# Patient Record
Sex: Male | Born: 1960 | Race: White | Hispanic: No | Marital: Married | Smoking: Former smoker
Health system: Southern US, Community
[De-identification: ages and names within clinical notes are randomized; demographics above are authoritative.]

## PROBLEM LIST (undated history)

## (undated) DIAGNOSIS — I10 Essential (primary) hypertension: Secondary | ICD-10-CM

## (undated) DIAGNOSIS — K759 Inflammatory liver disease, unspecified: Secondary | ICD-10-CM

## (undated) HISTORY — PX: TONSILLECTOMY: SUR1361

---

## 2005-02-14 ENCOUNTER — Emergency Department: Payer: Self-pay | Admitting: Emergency Medicine

## 2005-04-29 ENCOUNTER — Ambulatory Visit: Payer: Self-pay | Admitting: Orthopedic Surgery

## 2014-06-13 DIAGNOSIS — I10 Essential (primary) hypertension: Secondary | ICD-10-CM | POA: Diagnosis present

## 2014-06-13 DIAGNOSIS — Z8619 Personal history of other infectious and parasitic diseases: Secondary | ICD-10-CM | POA: Diagnosis present

## 2015-07-06 ENCOUNTER — Encounter: Payer: Self-pay | Admitting: *Deleted

## 2015-07-07 ENCOUNTER — Ambulatory Visit: Payer: BLUE CROSS/BLUE SHIELD | Admitting: Anesthesiology

## 2015-07-07 ENCOUNTER — Encounter: Payer: Self-pay | Admitting: *Deleted

## 2015-07-07 ENCOUNTER — Encounter
Admission: RE | Disposition: A | Discharge status: Home / Self Care | Payer: Self-pay | Source: Ambulatory Visit | Attending: Gastroenterology

## 2015-07-07 ENCOUNTER — Ambulatory Visit
Admission: RE | Admit: 2015-07-07 | Discharge: 2015-07-07 | Disposition: A | Discharge status: Home / Self Care | Payer: BLUE CROSS/BLUE SHIELD | Source: Ambulatory Visit | Attending: Gastroenterology | Admitting: Gastroenterology

## 2015-07-07 DIAGNOSIS — K6389 Other specified diseases of intestine: Secondary | ICD-10-CM | POA: Insufficient documentation

## 2015-07-07 DIAGNOSIS — Z1211 Encounter for screening for malignant neoplasm of colon: Secondary | ICD-10-CM | POA: Diagnosis not present

## 2015-07-07 DIAGNOSIS — D125 Benign neoplasm of sigmoid colon: Secondary | ICD-10-CM | POA: Insufficient documentation

## 2015-07-07 DIAGNOSIS — Z79899 Other long term (current) drug therapy: Secondary | ICD-10-CM | POA: Diagnosis not present

## 2015-07-07 DIAGNOSIS — B1920 Unspecified viral hepatitis C without hepatic coma: Secondary | ICD-10-CM | POA: Insufficient documentation

## 2015-07-07 DIAGNOSIS — Z87891 Personal history of nicotine dependence: Secondary | ICD-10-CM | POA: Diagnosis not present

## 2015-07-07 DIAGNOSIS — D122 Benign neoplasm of ascending colon: Secondary | ICD-10-CM | POA: Diagnosis not present

## 2015-07-07 DIAGNOSIS — K640 First degree hemorrhoids: Secondary | ICD-10-CM | POA: Diagnosis not present

## 2015-07-07 DIAGNOSIS — I10 Essential (primary) hypertension: Secondary | ICD-10-CM | POA: Diagnosis not present

## 2015-07-07 DIAGNOSIS — K5730 Diverticulosis of large intestine without perforation or abscess without bleeding: Secondary | ICD-10-CM | POA: Insufficient documentation

## 2015-07-07 DIAGNOSIS — Z7982 Long term (current) use of aspirin: Secondary | ICD-10-CM | POA: Insufficient documentation

## 2015-07-07 HISTORY — PX: COLONOSCOPY: SHX5424

## 2015-07-07 HISTORY — DX: Essential (primary) hypertension: I10

## 2015-07-07 HISTORY — DX: Inflammatory liver disease, unspecified: K75.9

## 2015-07-07 SURGERY — COLONOSCOPY
Anesthesia: General

## 2015-07-07 MED ORDER — MIDAZOLAM HCL 5 MG/5ML IJ SOLN
INTRAMUSCULAR | Status: DC | PRN
  Administered 2015-07-07: 2 mg via INTRAVENOUS

## 2015-07-07 MED ORDER — SODIUM CHLORIDE 0.9 % IV SOLN
INTRAVENOUS | Status: DC
  Administered 2015-07-07 (×2): via INTRAVENOUS

## 2015-07-07 MED ORDER — LIDOCAINE HCL (CARDIAC) 20 MG/ML IV SOLN
INTRAVENOUS | Status: DC | PRN
  Administered 2015-07-07: 40 mg via INTRAVENOUS

## 2015-07-07 MED ORDER — PROPOFOL 500 MG/50ML IV EMUL
INTRAVENOUS | Status: DC | PRN
  Administered 2015-07-07: 150 ug/kg/min via INTRAVENOUS

## 2015-07-07 MED ORDER — FENTANYL CITRATE (PF) 100 MCG/2ML IJ SOLN
INTRAMUSCULAR | Status: DC | PRN
  Administered 2015-07-07: 50 ug via INTRAVENOUS

## 2015-07-07 MED ORDER — EPHEDRINE SULFATE 50 MG/ML IJ SOLN
INTRAMUSCULAR | Status: DC | PRN
  Administered 2015-07-07: 10 mg via INTRAVENOUS

## 2015-07-07 MED ORDER — PROPOFOL 10 MG/ML IV BOLUS
INTRAVENOUS | Status: DC | PRN
  Administered 2015-07-07: 60 mg via INTRAVENOUS

## 2015-07-07 NOTE — OR Nursing (Signed)
NS lift done at [facility] colon polyp removal site.

## 2015-07-07 NOTE — Op Note (Signed)
[facility] Patient Name: Sean Sexton Procedure Date: 07/07/2015 1:09 PM MRN: [id number] Account #: [id number] Date of Birth: [birth year] Admit Type: Outpatient Age: 54 Room: [facility] ENDO ROOM 3 Gender: Male Note Status: Finalized Procedure:         Colonoscopy Indications:       Screening for colorectal malignant neoplasm, This is the                     patient's first colonoscopy Patient Profile:   This is a 54 year old male. Providers:         Gerrit Heck. Rayann Heman, MD Referring MD:      Caprice Renshaw (Referring MD) Medicines:         Propofol per Anesthesia Complications:     No immediate complications. Procedure:         Pre-Anesthesia Assessment:                    - Prior to the procedure, a History and Physical was                     performed, and patient medications, allergies and                     sensitivities were reviewed. The patient's tolerance of                     previous anesthesia was reviewed.                    - Prior to the procedure, a History and Physical was                     performed, and patient medications, allergies and                     sensitivities were reviewed. The patient's tolerance of                     previous anesthesia was reviewed.                    After obtaining informed consent, the colonoscope was                     passed under direct vision. Throughout the procedure, the                     patient's blood pressure, pulse, and oxygen saturations                     were monitored continuously. The Olympus CF-H180AL                     colonoscope ( S#: [id number] ) was introduced through the                     anus and advanced to the the cecum, identified by                     appendiceal orifice and ileocecal valve. The colonoscopy                     was performed without difficulty. The patient tolerated  the procedure well. The quality of the bowel preparation                      was excellent. Findings:      The perianal and digital rectal examinations were normal.      A localized area of altered vascular, erythematous and granular mucosa       was found in the cecum. Biopsies were taken with a cold forceps for       histology.      A 10 mm polyp was found in the ascending colon. The polyp was flat. The       polyp was removed with a saline injection-lift technique using a hot       snare. Resection and retrieval were complete.      A 3 mm polyp was found in the sigmoid colon. The polyp was sessile. The       polyp was removed with a jumbo cold forceps. Resection and retrieval       were complete.      A few small-mouthed diverticula were found in the sigmoid colon.      Internal hemorrhoids were found during retroflexion. The hemorrhoids       were Grade I (internal hemorrhoids that do not prolapse).      The exam was otherwise without abnormality. Impression:        - Altered vascular, erythematous and granular mucosa in                     the cecum. Biopsied.                    - One 10 mm polyp in the ascending colon. Resected and                     retrieved.                    - One 3 mm polyp in the sigmoid colon. Resected and                     retrieved.                    - Diverticulosis in the sigmoid colon.                    - Internal hemorrhoids.                    - The examination was otherwise normal. Recommendation:    - Observe patient in GI recovery unit.                    - High fiber diet.                    - Continue present medications.                    - Await pathology results.                    - Repeat colonoscopy for surveillance based on pathology                     results.                    - Return to referring physician.                    -  The findings and recommendations were discussed with the                     patient.                    - The findings and recommendations were  discussed with the                     patient's family. Procedure Code(s): --- Professional ---                    [phone], Colonoscopy, flexible; with endoscopic mucosal                     resection                    45380, 65, Colonoscopy, flexible; with biopsy, single or                     multiple Diagnosis Code(s): --- Professional ---                    Z12.11, Encounter for screening for malignant neoplasm of                     colon                    K63.89, Other specified diseases of intestine                    D12.2, Benign neoplasm of ascending colon                    D12.5, Benign neoplasm of sigmoid colon                    K64.0, First degree hemorrhoids                    K57.30, Diverticulosis of large intestine without                     perforation or abscess without bleeding CPT copyright 2014 American Medical Association. All rights reserved. The codes documented in this report are preliminary and upon coder review may  be revised to meet current compliance requirements. Mellody Life, MD 07/07/2015 1:41:00 PM This report has been signed electronically. Number of Addenda: 0 Note Initiated On: 07/07/2015 1:09 PM Scope Withdrawal Time: 0 hours 20 minutes 0 seconds  Total Procedure Duration: 0 hours 22 minutes 47 seconds       [facility]

## 2015-07-07 NOTE — Transfer of Care (Signed)
Immediate Anesthesia Transfer of Care Note  Patient: Sean Sexton  Procedure(s) Performed: Procedure(s): COLONOSCOPY (N/A)  Patient Location: PACU  Anesthesia Type:General  Level of Consciousness: sedated  Airway & Oxygen Therapy: Patient Spontanous Breathing and Patient connected to nasal cannula oxygen  Post-op Assessment: Report given to RN and Post -op Vital signs reviewed and stable  Post vital signs: Reviewed and stable  Last Vitals:  Filed Vitals:   07/07/15 1248 07/07/15 1340  BP: 115/77 90/67  Pulse: 70 79  Temp: 36.8 C 36 C  Resp: 20 12    Complications: No apparent anesthesia complications

## 2015-07-07 NOTE — Anesthesia Preprocedure Evaluation (Signed)
Anesthesia Evaluation  Patient identified by MRN, date of birth, ID band Patient awake    Airway Mallampati: II       Dental  (+) Teeth Intact   Pulmonary neg pulmonary ROS, former smoker,    Pulmonary exam normal        Cardiovascular Exercise Tolerance: Good hypertension, Pt. on medications  Rhythm:Regular     Neuro/Psych    GI/Hepatic negative GI ROS,   Endo/Other  negative endocrine ROS  Renal/GU negative Renal ROS     Musculoskeletal negative musculoskeletal ROS (+)   Abdominal Normal abdominal exam  (+)   Peds negative pediatric ROS (+)  Hematology negative hematology ROS (+)   Anesthesia Other Findings   Reproductive/Obstetrics                             Anesthesia Physical Anesthesia Plan  ASA: II  Anesthesia Plan: General   Post-op Pain Management:    Induction: Intravenous  Airway Management Planned: Nasal Cannula  Additional Equipment:   Intra-op Plan:   Post-operative Plan:   Informed Consent: I have reviewed the patients History and Physical, chart, labs and discussed the procedure including the risks, benefits and alternatives for the proposed anesthesia with the patient or authorized representative who has indicated his/her understanding and acceptance.     Plan Discussed with: CRNA  Anesthesia Plan Comments:         Anesthesia Quick Evaluation

## 2015-07-07 NOTE — Anesthesia Procedure Notes (Signed)
Date/Time: 07/07/2015 1:20 PM Performed by: Allean Found Pre-anesthesia Checklist: Patient identified, Emergency Drugs available, Suction available, Patient being monitored and Timeout performed Patient Re-evaluated:Patient Re-evaluated prior to inductionOxygen Delivery Method: Nasal cannula

## 2015-07-07 NOTE — Anesthesia Postprocedure Evaluation (Signed)
Anesthesia Post Note  Patient: Sean Sexton  Procedure(s) Performed: Procedure(s) (LRB): COLONOSCOPY (N/A)  Patient location during evaluation: PACU Anesthesia Type: General Level of consciousness: awake Pain management: pain level controlled Vital Signs Assessment: post-procedure vital signs reviewed and stable Respiratory status: respiratory function stable Cardiovascular status: stable Anesthetic complications: no    Last Vitals:  Filed Vitals:   07/07/15 1340 07/07/15 1350  BP: 90/67 95/66  Pulse: 79 79  Temp: 36 C   Resp: 12 18    Last Pain: There were no vitals filed for this visit.               VAN STAVEREN,Avo Schlachter

## 2015-07-07 NOTE — H&P (Signed)
  Primary Care Physician:  Marcello Fennel, MD  Pre-Procedure History & Physical: HPI:  Sean Sexton is a 54 y.o. male is here for an colonoscopy.   Past Medical History  Diagnosis Date  . Hypertension   . Hepatitis     hept.c    Past Surgical History  Procedure Laterality Date  . Tonsillectomy      Prior to Admission medications   Medication Sig Start Date End Date Taking? Authorizing Provider  aspirin EC 81 MG tablet Take 81 mg by mouth daily.   Yes Historical Provider, MD  lisinopril-hydrochlorothiazide (PRINZIDE,ZESTORETIC) 20-12.5 MG tablet Take 1 tablet by mouth daily.   Yes Historical Provider, MD  Multiple Vitamin (MULTIVITAMIN) tablet Take 1 tablet by mouth daily.   Yes Historical Provider, MD    Allergies as of 05/25/2015  . (Not on File)    History reviewed. No pertinent family history.  Social History   Social History  . Marital Status: Married    Spouse Name: N/A  . Number of Children: N/A  . Years of Education: N/A   Occupational History  . Not on file.   Social History Main Topics  . Smoking status: Former Research scientist (life sciences)  . Smokeless tobacco: Not on file  . Alcohol Use: No  . Drug Use: No  . Sexual Activity: Not on file   Other Topics Concern  . Not on file   Social History Narrative     Physical Exam: BP 115/77 mmHg  Pulse 70  Temp(Src) 98.2 F (36.8 C) (Oral)  Resp 20  Ht 6' (1.829 m)  Wt 92.987 kg (205 lb)  BMI 27.80 kg/m2  SpO2 99% General:   Alert,  pleasant and cooperative in NAD Head:  Normocephalic and atraumatic. Neck:  Supple; no masses or thyromegaly. Lungs:  Clear throughout to auscultation.    Heart:  Regular rate and rhythm. Abdomen:  Soft, nontender and nondistended. Normal bowel sounds, without guarding, and without rebound.   Neurologic:  Alert and  oriented x4;  grossly normal neurologically.  Impression/Plan: TIWAN MOSCHELLA is here for an colonoscopy to be performed for screening colon  Risks, benefits,  limitations, and alternatives regarding  colonoscopy have been reviewed with the patient.  Questions have been answered.  All parties agreeable.   Josefine Class, MD  07/07/2015, 1:07 PM

## 2015-07-10 ENCOUNTER — Encounter: Payer: Self-pay | Admitting: Gastroenterology

## 2015-07-10 ENCOUNTER — Other Ambulatory Visit: Payer: Self-pay | Admitting: Gastroenterology

## 2015-07-10 DIAGNOSIS — B182 Chronic viral hepatitis C: Secondary | ICD-10-CM

## 2015-07-10 LAB — SURGICAL PATHOLOGY

## 2015-07-19 ENCOUNTER — Ambulatory Visit: Admission: RE | Admit: 2015-07-19 | Payer: BLUE CROSS/BLUE SHIELD | Source: Ambulatory Visit

## 2015-07-19 ENCOUNTER — Ambulatory Visit: Payer: BLUE CROSS/BLUE SHIELD

## 2015-07-21 ENCOUNTER — Ambulatory Visit
Admission: RE | Admit: 2015-07-21 | Discharge: 2015-07-21 | Disposition: A | Discharge status: Home / Self Care | Payer: BLUE CROSS/BLUE SHIELD | Source: Ambulatory Visit | Attending: Gastroenterology | Admitting: Gastroenterology

## 2015-07-21 DIAGNOSIS — B182 Chronic viral hepatitis C: Secondary | ICD-10-CM | POA: Diagnosis present

## 2015-07-21 DIAGNOSIS — K8020 Calculus of gallbladder without cholecystitis without obstruction: Secondary | ICD-10-CM | POA: Insufficient documentation

## 2016-09-04 IMAGING — US US ABDOMEN LIMITED
1 series · 13 of 25 positions shown · non-contrast
Comparison: None.

CLINICAL DATA: Chronic hepatitis-C

EXAM:
US ABDOMEN LIMITED - RIGHT UPPER QUADRANT
ULTRASOUND HEPATIC ELASTOGRAPHY
TECHNIQUE: Limited right upper quadrant abdominal ultrasound was performed. In
addition, ultrasound elastography evaluation of the liver was
performed. A region of interest was placed in the right lobe of the
liver. Following application of a compressive sonographic pulse,
shear waves were detected in the adjacent hepatic tissue and the
shear wave velocity was calculated. Multiple assessments were
performed at the selected site. Median shear wave velocity is
correlated to a Metavir fibrosis score.

[Series 1: us abdomen limited · 0.21mm/px · 13 of 32 slices shown]
[im 1/32]
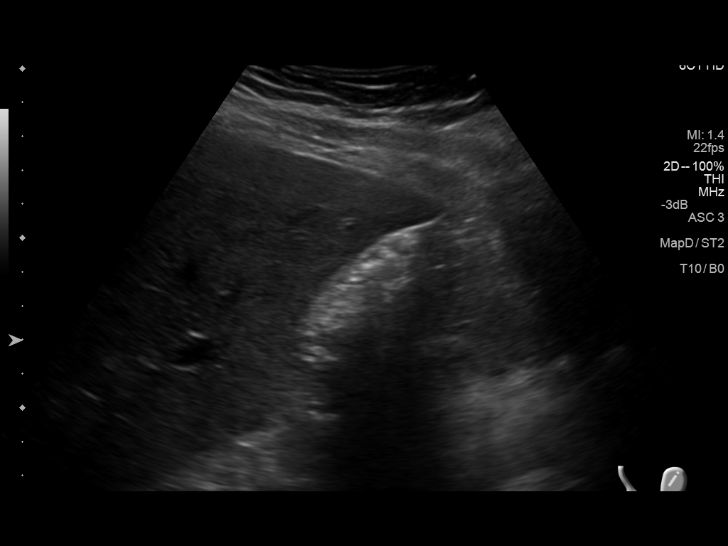
[im 3/32]
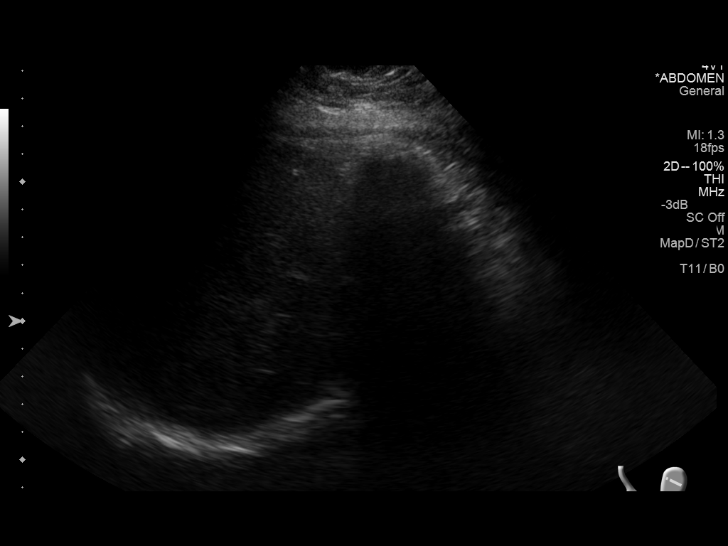
[im 6/32]
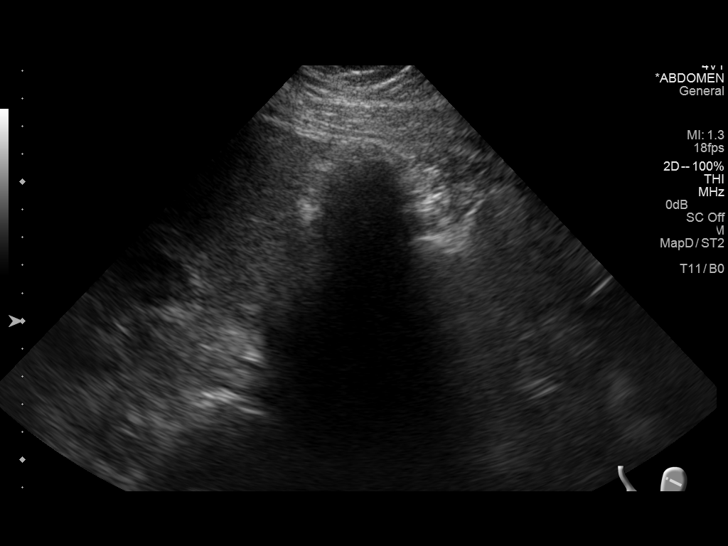
[im 8/32]
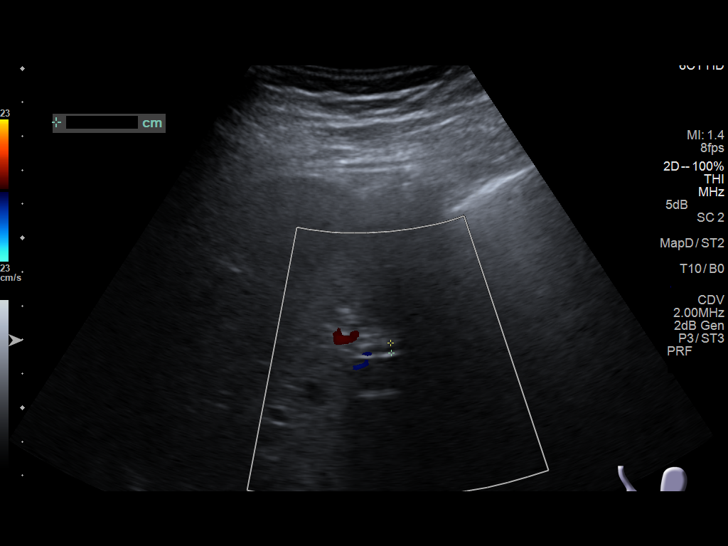
[im 11/32]
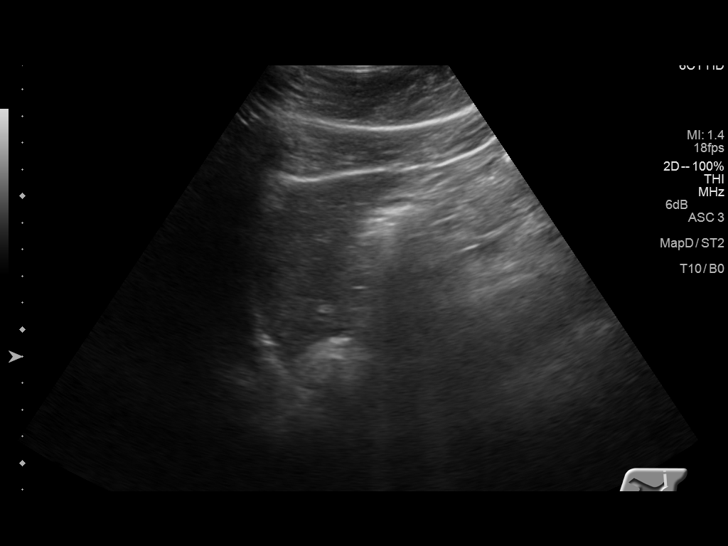
[im 13/32]
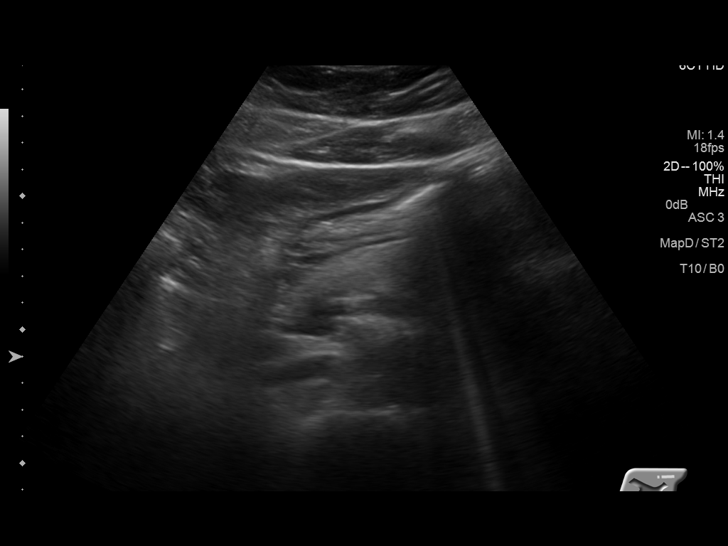
[im 16/32]
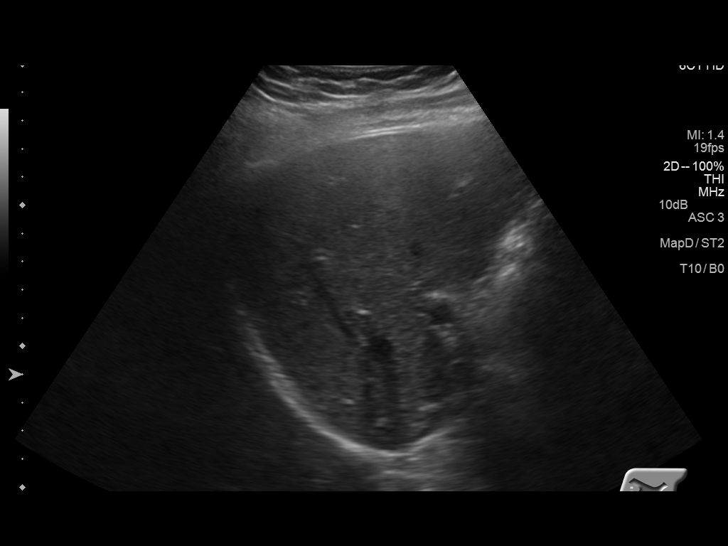
[im 19/32]
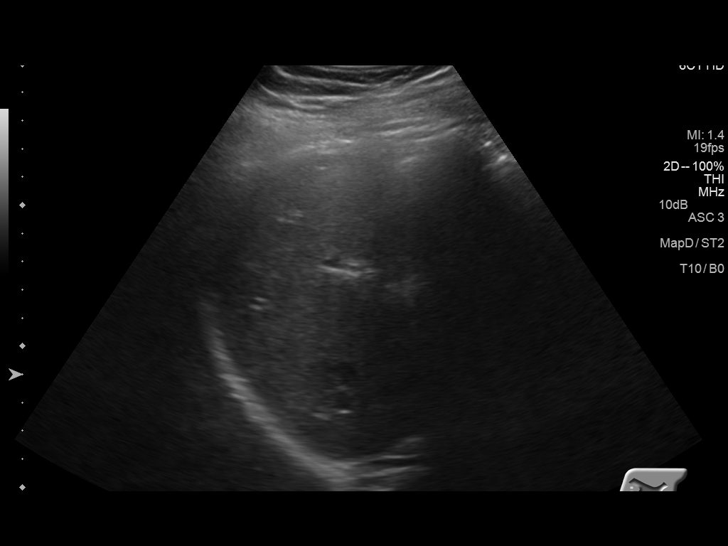
[im 21/32]
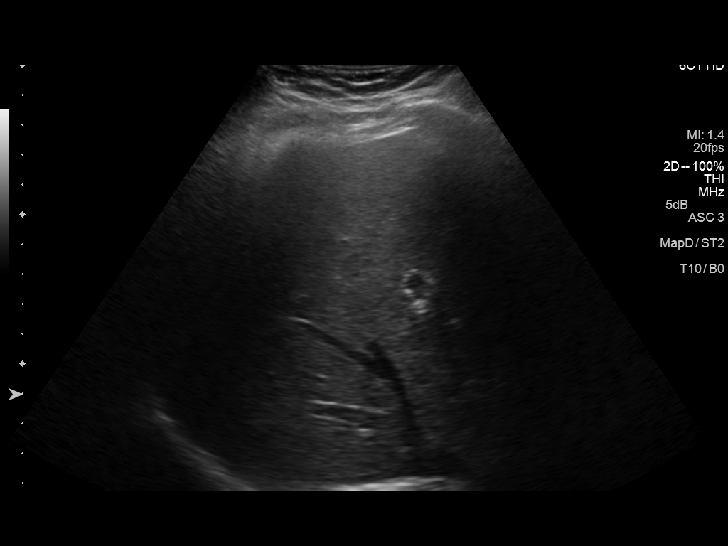
[im 24/32]
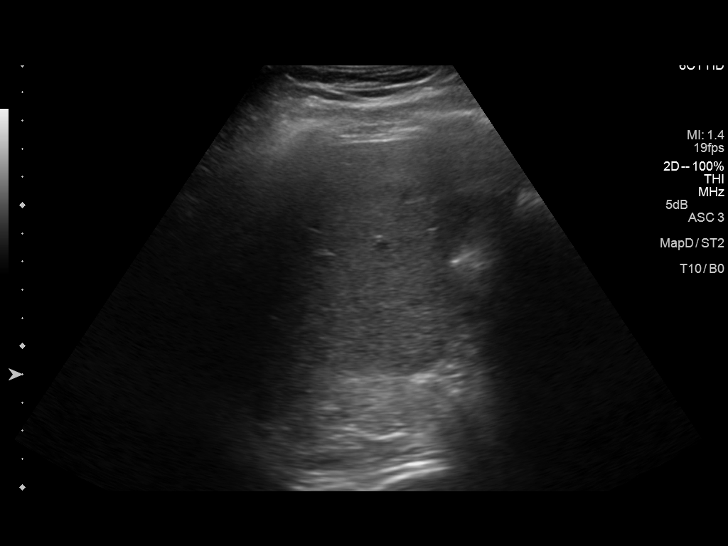
[im 26/32]
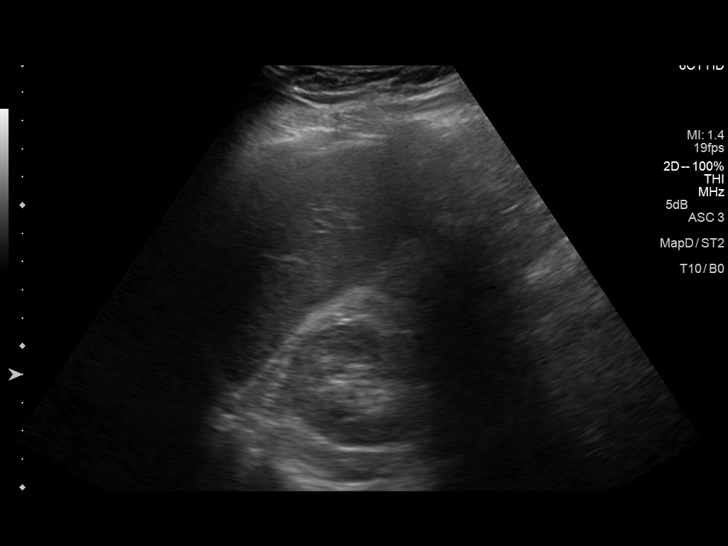
[im 29/32]
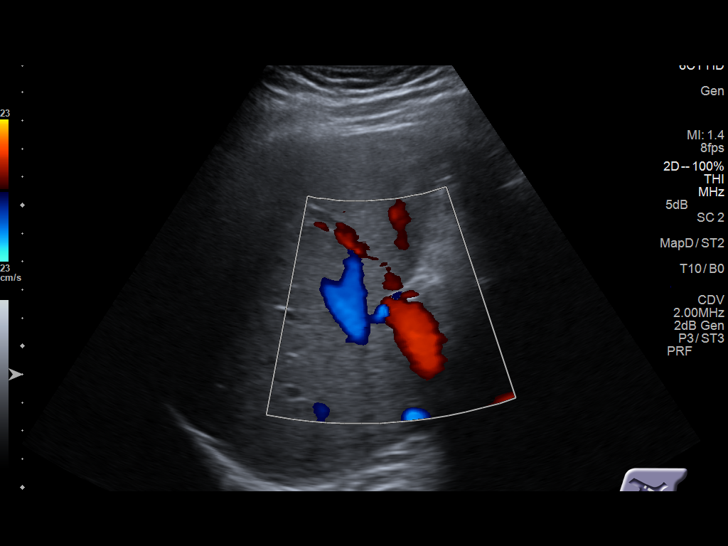
[im 32/32]
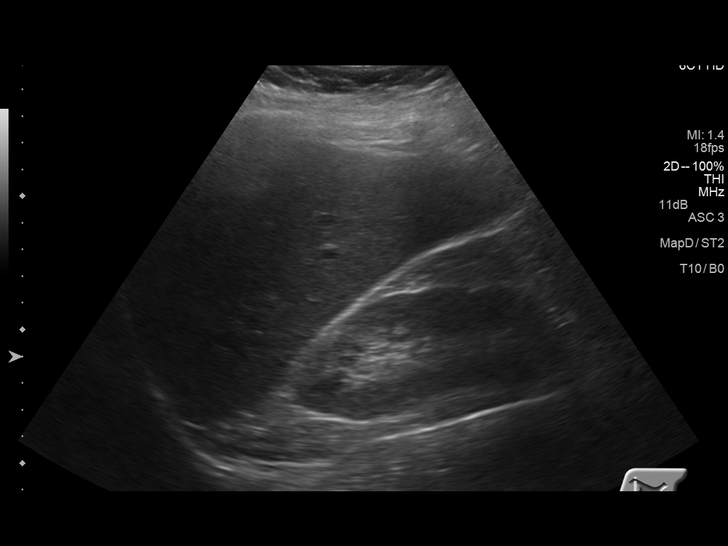

[13 of 25 positions shown; findings below may reference images not displayed]

FINDINGS: ULTRASOUND ABDOMEN LIMITED RIGHT UPPER QUADRANT

Gallbladder: Gallbladder is contracted and filled with stones.
Unable to visualize gallbladder wall. Negative sonographic Saelee.

Common bile duct: Diameter: Normal caliber, 3 mm

Liver: No focal lesion identified. Within normal limits in
parenchymal echogenicity.

ULTRASOUND HEPATIC ELASTOGRAPHY

Device: Siemens Helix VQ

Transducer: 6 H0

Patient position: Spine

Number of measurements:  10

Hepatic Segment:  8

Median velocity:   0.87  m/sec

IQR:

IQR/Median velocity ratio:

Corresponding Metavir fibrosis score:  F0/F1

Risk of fibrosis: Minimal

Limitations of exam: None

Pertinent findings noted on other imaging exams:  None

Please note that abnormal shear wave velocities may also be
identified in clinical settings other than with hepatic fibrosis,
such as: acute hepatitis, elevated right heart and central venous
pressures including use of beta blockers, Hilaluddin disease
(Perico), infiltrative processes such as
mastocytosis/amyloidosis/infiltrative tumor, extrahepatic
cholestasis, in the post-prandial state, and liver transplantation.
Correlation with patient history, laboratory data, and clinical
condition recommended.
IMPRESSION: Cholelithiasis. Gallbladder is contracted. No sonographic evidence
of acute cholecystitis.

Median hepatic shear wave velocity is calculated at 0.87 m/sec.
Corresponding Metavir fibrosis score is F 0/F 1.
Risk of fibrosis is minimal.

Follow-up:  None required

## 2022-05-01 ENCOUNTER — Other Ambulatory Visit: Payer: Self-pay

## 2022-05-01 DIAGNOSIS — Z125 Encounter for screening for malignant neoplasm of prostate: Secondary | ICD-10-CM

## 2022-05-06 ENCOUNTER — Other Ambulatory Visit: Payer: Self-pay | Admitting: *Deleted

## 2022-05-06 DIAGNOSIS — Z125 Encounter for screening for malignant neoplasm of prostate: Secondary | ICD-10-CM

## 2022-05-06 NOTE — Progress Notes (Signed)
Patient: Sean Sexton           Date of Birth: [birth year]           MRN: [id number] Visit Date: 05/06/2022 PCP: Derinda Late, MD  Prostate Cancer Screening Date of last physical exam:  (January 2023) Date of last rectal exam:  (N/A) Have you ever had any of the following?: Family member with prostate cancer Have you ever had or been told you have an allergy to latex products?: No Are you currently taking any natural prostate preparations?: No Are you currently experiencing any urinary symptoms?: No  Prostate Exam Exam not completed.  PSA Only Completed. Patient's History There are no problems to display for this patient.  Past Medical History:  Diagnosis Date   Hepatitis    hept.c   Hypertension     No family history on file.  Social History   Occupational History   Not on file  Tobacco Use   Smoking status: Former   Smokeless tobacco: Not on file  Substance and Sexual Activity   Alcohol use: No   Drug use: No   Sexual activity: Not on file

## 2022-05-07 LAB — PSA: Prostate Specific Ag, Serum: 0.7 ng/mL (ref 0.0–4.0)

## 2022-05-17 ENCOUNTER — Telehealth: Payer: Self-pay

## 2022-05-17 NOTE — Telephone Encounter (Signed)
Attempted to contact patient regarding PSA results, no answer, "Call can not be completed at this time'.

## 2022-06-12 ENCOUNTER — Telehealth: Payer: Self-pay

## 2022-06-12 NOTE — Telephone Encounter (Signed)
Normal PSA letter mailed.   June 12, 2022         Dear Mr. Sean Sexton,   Thank you for your participation in the Prostate Cancer Screening at [facility] on May 06, 2022.   The result of your blood test for the level of the Prostate Specific Antigen (PSA) was found to be normal. It is recommended that continue yearly screening and share these results with your physician.  If you have any questions about these results, please call Etheleen Sia at [phone] or [facility] at [phone].  Sincerely,     Etheleen Sia, MSN, [place], [place]

## 2022-07-12 DIAGNOSIS — K859 Acute pancreatitis without necrosis or infection, unspecified: Secondary | ICD-10-CM | POA: Diagnosis not present

## 2022-07-12 DIAGNOSIS — B1920 Unspecified viral hepatitis C without hepatic coma: Secondary | ICD-10-CM | POA: Diagnosis present

## 2022-07-12 DIAGNOSIS — Z87891 Personal history of nicotine dependence: Secondary | ICD-10-CM

## 2022-07-12 DIAGNOSIS — K8511 Biliary acute pancreatitis with uninfected necrosis: Secondary | ICD-10-CM | POA: Diagnosis not present

## 2022-07-12 DIAGNOSIS — I10 Essential (primary) hypertension: Secondary | ICD-10-CM | POA: Diagnosis present

## 2022-07-12 DIAGNOSIS — Z79899 Other long term (current) drug therapy: Secondary | ICD-10-CM

## 2022-07-12 DIAGNOSIS — Z7982 Long term (current) use of aspirin: Secondary | ICD-10-CM

## 2022-07-12 DIAGNOSIS — N179 Acute kidney failure, unspecified: Secondary | ICD-10-CM | POA: Diagnosis present

## 2022-07-12 DIAGNOSIS — K8000 Calculus of gallbladder with acute cholecystitis without obstruction: Secondary | ICD-10-CM | POA: Diagnosis present

## 2022-07-12 DIAGNOSIS — A419 Sepsis, unspecified organism: Secondary | ICD-10-CM | POA: Diagnosis not present

## 2022-07-12 LAB — CBC
HCT: 46.6 % (ref 39.0–52.0)
Hemoglobin: 16 g/dL (ref 13.0–17.0)
MCH: 35.4 pg — ABNORMAL HIGH (ref 26.0–34.0)
MCHC: 34.3 g/dL (ref 30.0–36.0)
MCV: 103.1 fL — ABNORMAL HIGH (ref 80.0–100.0)
Platelets: 253 10*3/uL (ref 150–400)
RBC: 4.52 MIL/uL (ref 4.22–5.81)
RDW: 12.9 % (ref 11.5–15.5)
WBC: 13.2 10*3/uL — ABNORMAL HIGH (ref 4.0–10.5)
nRBC: 0 % (ref 0.0–0.2)

## 2022-07-12 LAB — COMPREHENSIVE METABOLIC PANEL
ALT: 161 U/L — ABNORMAL HIGH (ref 0–44)
AST: 199 U/L — ABNORMAL HIGH (ref 15–41)
Albumin: 3.7 g/dL (ref 3.5–5.0)
Alkaline Phosphatase: 128 U/L — ABNORMAL HIGH (ref 38–126)
Anion gap: 8 (ref 5–15)
BUN: 26 mg/dL — ABNORMAL HIGH (ref 8–23)
CO2: 27 mmol/L (ref 22–32)
Calcium: 8.9 mg/dL (ref 8.9–10.3)
Chloride: 102 mmol/L (ref 98–111)
Creatinine, Ser: 1.37 mg/dL — ABNORMAL HIGH (ref 0.61–1.24)
GFR, Estimated: 58 mL/min — ABNORMAL LOW (ref >60)
Glucose, Bld: 143 mg/dL — ABNORMAL HIGH (ref 70–99)
Potassium: 3.8 mmol/L (ref 3.5–5.1)
Sodium: 137 mmol/L (ref 135–145)
Total Bilirubin: 1.1 mg/dL (ref 0.3–1.2)
Total Protein: 7.6 g/dL (ref 6.5–8.1)

## 2022-07-12 NOTE — ED Triage Notes (Signed)
Pt sts that he is having abd pain that is in the center of the abd. Pt has been vomiting

## 2022-07-13 ENCOUNTER — Inpatient Hospital Stay
Admission: EM | Admit: 2022-07-13 | Discharge: 2022-07-21 | DRG: 417 | Disposition: A | Discharge status: Home / Self Care | Payer: 59 | Attending: Internal Medicine | Admitting: Internal Medicine

## 2022-07-13 ENCOUNTER — Other Ambulatory Visit: Payer: Self-pay

## 2022-07-13 ENCOUNTER — Encounter: Payer: Self-pay | Admitting: Internal Medicine

## 2022-07-13 ENCOUNTER — Emergency Department: Payer: 59

## 2022-07-13 DIAGNOSIS — K8591 Acute pancreatitis with uninfected necrosis, unspecified: Secondary | ICD-10-CM

## 2022-07-13 DIAGNOSIS — K859 Acute pancreatitis without necrosis or infection, unspecified: Secondary | ICD-10-CM | POA: Diagnosis present

## 2022-07-13 DIAGNOSIS — R7989 Other specified abnormal findings of blood chemistry: Secondary | ICD-10-CM

## 2022-07-13 DIAGNOSIS — Z7982 Long term (current) use of aspirin: Secondary | ICD-10-CM | POA: Diagnosis not present

## 2022-07-13 DIAGNOSIS — K851 Biliary acute pancreatitis without necrosis or infection: Secondary | ICD-10-CM | POA: Diagnosis not present

## 2022-07-13 DIAGNOSIS — N179 Acute kidney failure, unspecified: Secondary | ICD-10-CM

## 2022-07-13 DIAGNOSIS — K8000 Calculus of gallbladder with acute cholecystitis without obstruction: Secondary | ICD-10-CM | POA: Diagnosis present

## 2022-07-13 DIAGNOSIS — Z79899 Other long term (current) drug therapy: Secondary | ICD-10-CM | POA: Diagnosis not present

## 2022-07-13 DIAGNOSIS — I10 Essential (primary) hypertension: Secondary | ICD-10-CM | POA: Diagnosis present

## 2022-07-13 DIAGNOSIS — K8511 Biliary acute pancreatitis with uninfected necrosis: Secondary | ICD-10-CM | POA: Diagnosis present

## 2022-07-13 DIAGNOSIS — Z87891 Personal history of nicotine dependence: Secondary | ICD-10-CM | POA: Diagnosis not present

## 2022-07-13 DIAGNOSIS — Z8619 Personal history of other infectious and parasitic diseases: Secondary | ICD-10-CM | POA: Diagnosis present

## 2022-07-13 DIAGNOSIS — B1920 Unspecified viral hepatitis C without hepatic coma: Secondary | ICD-10-CM | POA: Diagnosis present

## 2022-07-13 DIAGNOSIS — A419 Sepsis, unspecified organism: Secondary | ICD-10-CM | POA: Diagnosis not present

## 2022-07-13 DIAGNOSIS — R112 Nausea with vomiting, unspecified: Secondary | ICD-10-CM

## 2022-07-13 LAB — HEPATITIS PANEL, ACUTE
HCV Ab: REACTIVE — AB
Hep A IgM: NONREACTIVE
Hep B C IgM: NONREACTIVE
Hepatitis B Surface Ag: NONREACTIVE

## 2022-07-13 LAB — COMPREHENSIVE METABOLIC PANEL
ALT: 103 U/L — ABNORMAL HIGH (ref 0–44)
AST: 86 U/L — ABNORMAL HIGH (ref 15–41)
Albumin: 3.3 g/dL — ABNORMAL LOW (ref 3.5–5.0)
Alkaline Phosphatase: 97 U/L (ref 38–126)
Anion gap: 6 (ref 5–15)
BUN: 24 mg/dL — ABNORMAL HIGH (ref 8–23)
CO2: 27 mmol/L (ref 22–32)
Calcium: 8.3 mg/dL — ABNORMAL LOW (ref 8.9–10.3)
Chloride: 105 mmol/L (ref 98–111)
Creatinine, Ser: 1.14 mg/dL (ref 0.61–1.24)
GFR, Estimated: >60 mL/min (ref >60)
Glucose, Bld: 93 mg/dL (ref 70–99)
Potassium: 3.7 mmol/L (ref 3.5–5.1)
Sodium: 138 mmol/L (ref 135–145)
Total Bilirubin: 1.3 mg/dL — ABNORMAL HIGH (ref 0.3–1.2)
Total Protein: 6.9 g/dL (ref 6.5–8.1)

## 2022-07-13 LAB — LIPASE, BLOOD
Lipase: 1042 U/L — ABNORMAL HIGH (ref 11–51)
Lipase: 310 U/L — ABNORMAL HIGH (ref 11–51)

## 2022-07-13 LAB — HIV ANTIBODY (ROUTINE TESTING W REFLEX): HIV Screen 4th Generation wRfx: NONREACTIVE

## 2022-07-13 LAB — CBG MONITORING, ED: Glucose-Capillary: 94 mg/dL (ref 70–99)

## 2022-07-13 MED ORDER — ONDANSETRON HCL 4 MG PO TABS: 4.0000 mg | ORAL_TABLET | Freq: Four times a day (QID) | ORAL | Status: DC | PRN

## 2022-07-13 MED ORDER — MORPHINE SULFATE (PF) 4 MG/ML IV SOLN
4.0000 mg | Freq: Once | INTRAVENOUS | Status: AC
  Administered 2022-07-13: 4 mg via INTRAVENOUS
  Filled 2022-07-13: qty 1

## 2022-07-13 MED ORDER — ONDANSETRON HCL 4 MG/2ML IJ SOLN
4.0000 mg | INTRAMUSCULAR | Status: AC
  Administered 2022-07-13: 4 mg via INTRAVENOUS
  Filled 2022-07-13: qty 2

## 2022-07-13 MED ORDER — HYDRALAZINE HCL 20 MG/ML IJ SOLN: 5.0000 mg | INTRAMUSCULAR | Status: DC | PRN

## 2022-07-13 MED ORDER — ACETAMINOPHEN 325 MG PO TABS
650.0000 mg | ORAL_TABLET | Freq: Four times a day (QID) | ORAL | Status: DC | PRN
  Administered 2022-07-15 – 2022-07-20 (×3): 650 mg via ORAL
  Filled 2022-07-13 (×3): qty 2

## 2022-07-13 MED ORDER — MORPHINE SULFATE (PF) 2 MG/ML IV SOLN
2.0000 mg | INTRAVENOUS | Status: DC | PRN
  Administered 2022-07-13 – 2022-07-16 (×18): 2 mg via INTRAVENOUS
  Filled 2022-07-13 (×18): qty 1

## 2022-07-13 MED ORDER — LACTATED RINGERS IV SOLN: INTRAVENOUS | Status: DC

## 2022-07-13 MED ORDER — ACETAMINOPHEN 650 MG RE SUPP: 650.0000 mg | Freq: Four times a day (QID) | RECTAL | Status: DC | PRN

## 2022-07-13 MED ORDER — ONDANSETRON HCL 4 MG/2ML IJ SOLN: 4.0000 mg | Freq: Four times a day (QID) | INTRAMUSCULAR | Status: DC | PRN

## 2022-07-13 MED ORDER — LACTATED RINGERS IV BOLUS
1000.0000 mL | Freq: Once | INTRAVENOUS | Status: AC
  Administered 2022-07-13: 1000 mL via INTRAVENOUS

## 2022-07-13 NOTE — Assessment & Plan Note (Signed)
Hydralazine IV as needed while NPO

## 2022-07-13 NOTE — Progress Notes (Signed)
  PROGRESS NOTE    Sean Sexton  MAY:[id number] DOB: 1961/06/12 DOA: 07/13/2022 PCP: Derinda Late, MD  ED32A/ED32A  LOS: 0 days   Brief hospital course:   Assessment & Plan: Sean Sexton is a 61 y.o. male with medical history significant for Hepatitis C s/p Harvoni x 12 weeks, HTN, history of adenomatous colon polyp, who presents to the ED with a 1 to 2-day history of abdominal pain in the mid upper abdomen associated with nonbloody nonbilious vomiting.   * Acute gallstone pancreatitis lipase of 1042, AST 199, ALT 161, alk phos 128 and normal bili of 1.1. Patient denies alcohol use Right upper quadrant ultrasound showed Cholelithiasis. But no evidence of acute cholecystitis or bile duct obstruction. Plan: --cont [id number]  --pain management --GenSurg consult today, planning for inpatient cholecystectomy   History of hepatitis C Completed prior 12-week treatment with Harvoni   Benign essential hypertension --hold home lisinopril-hydrochlorothiazide    DVT prophylaxis: SCD/Compression stockings Code Status: Full code  Family Communication: wife updated at bedside today Level of care: Med-Surg Dispo:   The patient is from: home Anticipated d/c is to: home Anticipated d/c date is: 2-3 days   Subjective and Interval History:  Pt still having pain.  US abdomen didn't show acute cholecystitis or bile duct obstruction.  GenSurg consult today.   Objective: Vitals:   07/13/22 0900 07/13/22 1000 07/13/22 1200 07/13/22 1734  BP: (!) 141/86 (!) 137/96 (!) 147/89 117/64  Pulse: 76 74 73 86  Resp: 18   18  Temp:   97.7 F (36.5 C) 98.1 F (36.7 C)  TempSrc:   Oral Oral  SpO2: 96% 97% 96% 95%  Weight:      Height:        Intake/Output Summary (Last 24 hours) at 07/13/2022 1839 Last data filed at 07/13/2022 1700 Gross per 24 hour  Intake --  Output 200 ml  Net -200 ml   Filed Weights   07/12/22 2138 07/12/22 2148  Weight: 90.7 kg 90.7 kg     Examination:   Constitutional: NAD, AAOx3 HEENT: conjunctivae and lids normal, EOMI CV: No cyanosis.   RESP: normal respiratory effort, on RA Neuro: II - XII grossly intact.   Psych: Normal mood and affect.  Appropriate judgement and reason   Data Reviewed: I have personally reviewed labs and imaging studies  Time spent: 35 minutes  Enzo Bi, MD [facility] If 7PM-7AM, please contact night-coverage 07/13/2022, 6:39 PM

## 2022-07-13 NOTE — Consult Note (Signed)
Sean Lame, MD [facility]  [address] [place], [place] [address] Phone: [phone] Fax : [phone]  Consultation  Referring Provider:     Dr. Damita Dunnings Primary Care Physician:  Derinda Late, MD Primary Gastroenterologist: [facility]         Reason for Consultation:     Pancreatitis  Date of Admission:  07/13/2022 Date of Consultation:  07/13/2022         HPI:   Sean Sexton is a 61 y.o. male who was admitted with a history of polyps removed back in 2016.  The patient had an adenoma at the time of the colonoscopy.  The patient also has a history of hepatitis C and treated for that.  The patient reports that he has generalized pains and takes Advil every day.  The patient had reported 2 days of abdominal pain in the mid abdomen with nausea and vomiting.  The patient denies ever having symptoms like this in the past.  The patient did have a ultrasound showing cholelithiasis but no evidence of acute cholecystitis with mildly nodular liver suspicious for cirrhosis with possible trace ascites. Despite the report of possible cirrhosis on the ultrasound there is no thrombocytopenia or hypoalbuminemia seen. Patient had normalized his liver enzymes back in September 2021 but the liver enzymes were again elevated in June 2022 with AST of 74 ALT of 165 and alkaline phosphatase of 240 with a normal bilirubin.  The liver enzymes again returned to normal in January of this year. On admission yesterday the patient's AST is 199 with ALT of 161 with alkaline phosphatase of 128 and normal bilirubin.  Patient's wife also states that the patient takes diet supplementation in addition to his daily Advil.  The patient his wife and son deny any alcohol abuse.  Past Medical History:  Diagnosis Date   Hepatitis    hept.c   Hypertension     Past Surgical History:  Procedure Laterality Date   COLONOSCOPY N/A 07/07/2015   Procedure: COLONOSCOPY;  Surgeon: Josefine Class, MD;  Location:  [facility] ENDOSCOPY;  Service: Endoscopy;  Laterality: N/A;   TONSILLECTOMY      Prior to Admission medications   Medication Sig Start Date End Date Taking? Authorizing Provider  aspirin EC 81 MG tablet Take 81 mg by mouth daily.    [provider]  lisinopril-hydrochlorothiazide (PRINZIDE,ZESTORETIC) 20-12.5 MG tablet Take 1 tablet by mouth daily.    [provider]  Multiple Vitamin (MULTIVITAMIN) tablet Take 1 tablet by mouth daily.    [provider]  pantoprazole (PROTONIX) 20 MG tablet Take 20 mg by mouth daily.    [provider]  sildenafil (REVATIO) 20 MG tablet Take 60 mg by mouth daily as needed.    [provider]    No family history on file.   Social History   Tobacco Use   Smoking status: Former  Substance Use Topics   Alcohol use: No   Drug use: No    Allergies as of 07/12/2022   (No Known Allergies)    Review of Systems:    All systems reviewed and negative except where noted in HPI.   Physical Exam:  Vital signs in last 24 hours: Temp:  [97.7 F (36.5 C)-98.8 F (37.1 C)] 97.7 F (36.5 C) (12/09 1200) Pulse Rate:  [69-83] 73 (12/09 1200) Resp:  [15-20] 18 (12/09 0900) BP: (113-160)/(71-96) 147/89 (12/09 1200) SpO2:  [96 %-100 %] 96 % (12/09 1200) Weight:  [  90.7 kg] 90.7 kg (12/08 2148)   General:   Pleasant, cooperative in NAD Head:  Normocephalic and atraumatic. Eyes:   No icterus.   Conjunctiva pink. PERRLA. Ears:  Normal auditory acuity. Neck:  Supple; no masses or thyroidomegaly Lungs: Respirations even and unlabored. Lungs clear to auscultation bilaterally.   No wheezes, crackles, or rhonchi.  Heart:  Regular rate and rhythm;  Without murmur, clicks, rubs or gallops Abdomen:  Soft, positive tenderness in the epigastric area, . Normal bowel sounds. No appreciable masses or hepatomegaly.  No rebound or guarding.  Rectal:  Not performed. Msk:  Symmetrical without gross deformities.    Extremities:   Without edema, cyanosis or clubbing. Neurologic:  Alert and oriented x3;  grossly normal neurologically. Skin:  Intact without significant lesions or rashes. Cervical Nodes:  No significant cervical adenopathy. Psych:  Alert and cooperative. Normal affect.  LAB RESULTS: Recent Labs    07/12/22 2139  WBC 13.2*  HGB 16.0  HCT 46.6  PLT 253   BMET Recent Labs    07/12/22 2139  NA 137  K 3.8  CL 102  CO2 27  GLUCOSE 143*  BUN 26*  CREATININE 1.37*  CALCIUM 8.9   LFT Recent Labs    07/12/22 2139  PROT 7.6  ALBUMIN 3.7  AST 199*  ALT 161*  ALKPHOS 128*  BILITOT 1.1   PT/INR No results for input(s): "LABPROT", "INR" in the last 72 hours.  STUDIES: US ABDOMEN LIMITED RUQ (LIVER/GB)  Result Date: 07/13/2022 CLINICAL DATA:  61 year old male with abnormal lipase. Query gallstone pancreatitis. History of chronic hepatitis C. EXAM: ULTRASOUND ABDOMEN LIMITED RIGHT UPPER QUADRANT COMPARISON:  Abdomen ultrasound 07/21/2015. FINDINGS: Gallbladder: Shadowing echogenic gallstones individually estimated up to 13 mm. Gallbladder wall thickness is at the upper limits of normal, 2-3 mm. No pericholecystic fluid. No sonographic Murphy sign elicited. Common bile duct: Diameter: 4 mm, normal. Liver: Background liver echogenicity appears normal. Mildly nodular liver contour suggested on multiple images (image 51). No discrete liver lesion. Portal vein is patent on color Doppler imaging with normal direction of blood flow towards the liver. Other: Questionable trace perihepatic fluid on image 51. Grossly negative visible right kidney. IMPRESSION: 1. Cholelithiasis. But no evidence of acute cholecystitis or bile duct obstruction. 2. Mildly nodular liver contour suspicious for Cirrhosis. Possible trace ascites. No discrete liver lesion by ultrasound. Electronically Signed   By: Genevie Ann M.D.   On: 07/13/2022 05:46      Impression / Plan:   Assessment: Principal Problem:   Acute  pancreatitis Active Problems:   Benign essential hypertension   History of hepatitis C   Sean Sexton is a 61 y.o. y/o male with acute pancreatitis with a normal common bile duct without any signs of complicated pancreatitis.  The patient has been told that diet aids have been associated with pancreatitis and his abnormal liver enzymes may be related to his NSAID use.  The patient also has a history of hepatitis C and imaging that shows possible nodularity and cirrhosis although his platelets and albumin are normal which goes against this being advanced cirrhosis.  Plan:  The patient will have his labs checked again today including his lipase to see if it is going up or down.  The patient will also have a hepatitis C viral load to see if the hepatitis C had returned.  He will also have his liver enzymes checked again today.  Symptomatic treatment for his pancreatitis should include IV hydration which appears  he is getting as well as pain medication which she is also getting.  The patient should consider having his gallbladder out in the future since he likely has gallstone pancreatitis with more stones in the gallbladder seen on ultrasound.  The patient and his family have been explained the plan and agree with it.  Thank you for involving me in the care of this patient.      LOS: 0 days   Sean Lame, MD, [facility] 07/13/2022, 2:18 PM,  Pager [phone] 7am-5pm  Check AMION for 5pm -7am coverage and on weekends   Note: This dictation was prepared with Dragon dictation along with smaller phrase technology. Any transcriptional errors that result from this process are unintentional.

## 2022-07-13 NOTE — ED Provider Notes (Signed)
[facility] Provider Note    Event Date/Time   First MD Initiated Contact with Patient 07/13/22 [phone]     (approximate)   History   Abdominal Pain   HPI  Sean Sexton is a 61 y.o. male who reports no specific chronic medical history but whose medical record indicates a history of hepatitis C and hypertension.  He reports a distant history of alcohol abuse but not for years.  He presents tonight for evaluation of acute onset and severe abdominal pain that started yesterday around noon.  It started rather acutely after he ate lunch.  It is associate with nausea and vomiting and has been constant.  He said he is never experienced anything like this before and it is almost unbearable.  It is in the upper middle part of his abdomen.  He is not able to eat or drink anything and is vomited multiple times.  No diarrhea.  No lower abdominal pain.     Physical Exam   Triage Vital Signs: ED Triage Vitals [07/12/22 2138]  Enc Vitals Group     BP 139/79     Pulse Rate 82     Resp 16     Temp 98.3 F (36.8 C)     Temp Source Oral     SpO2 97 %     Weight 90.7 kg (200 lb)     Height 1.829 m (6')     Head Circumference      Peak Flow      Pain Score      Pain Loc      Pain Edu?      Excl. in [place]?     Most recent vital signs: Vitals:   07/13/22 0430 07/13/22 0500  BP: (!) 149/88 (!) 151/88  Pulse: 70 69  Resp:  18  Temp:    SpO2:  100%     General: Awake, appears uncomfortable but nontoxic. CV:  Good peripheral perfusion.  Regular rate and rhythm.   Resp:  Normal effort.  Breathing easily comfortably, speaking clearly, no accessory muscle usage or intercostal retractions. Abd:  No distention.  Severely tender to palpation in the epigastrium, mild tenderness in the right upper quadrant but with negative Murphy sign.  No lower abdominal tenderness to palpation nor guarding. Other:  No jaundice nor scleral icterus.   ED Results / Procedures /  Treatments   Labs (all labs ordered are listed, but only abnormal results are displayed) Labs Reviewed  LIPASE, BLOOD - Abnormal; Notable for the following components:      Result Value   Lipase 1,042 (*)    All other components within normal limits  COMPREHENSIVE METABOLIC PANEL - Abnormal; Notable for the following components:   Glucose, Bld 143 (*)    BUN 26 (*)    Creatinine, Ser 1.37 (*)    AST 199 (*)    ALT 161 (*)    Alkaline Phosphatase 128 (*)    GFR, Estimated 58 (*)    All other components within normal limits  CBC - Abnormal; Notable for the following components:   WBC 13.2 (*)    MCV 103.1 (*)    MCH 35.4 (*)    All other components within normal limits  HEPATITIS PANEL, ACUTE  HIV ANTIBODY (ROUTINE TESTING W REFLEX)     EKG  ED ECG REPORT I, Hinda Kehr, the attending physician, personally viewed and interpreted this ECG.  Date: 07/12/2022 EKG Time: 21: 42 Rate:  77 Rhythm: normal sinus rhythm QRS Axis: Left axis deviation Intervals: normal ST/T Wave abnormalities: normal Narrative Interpretation: no evidence of acute ischemia    RADIOLOGY I viewed and interpreted the patient's right upper quadrant ultrasound.  See below for details, but there is no evidence of cholecystitis.    PROCEDURES:  Critical Care performed: No  Procedures   MEDICATIONS ORDERED IN ED: Medications  hydrALAZINE (APRESOLINE) injection 5 mg (has no administration in time range)  acetaminophen (TYLENOL) tablet 650 mg (has no administration in time range)    Or  acetaminophen (TYLENOL) suppository 650 mg (has no administration in time range)  ondansetron (ZOFRAN) tablet 4 mg (has no administration in time range)    Or  ondansetron (ZOFRAN) injection 4 mg (has no administration in time range)  lactated ringers infusion ( Intravenous New Bag/Given 07/13/22 0540)  morphine (PF) 2 MG/ML injection 2 mg (has no administration in time range)  morphine (PF) 4 MG/ML injection  4 mg (4 mg Intravenous Given 07/13/22 0422)  ondansetron (ZOFRAN) injection 4 mg (4 mg Intravenous Given 07/13/22 0422)  lactated ringers bolus 1,000 mL (0 mLs Intravenous Stopped 07/13/22 0541)     IMPRESSION / MDM / ASSESSMENT AND PLAN / ED COURSE  I reviewed the triage vital signs and the nursing notes.                              Differential diagnosis includes, but is not limited to, pancreatitis, biliary disease, medication or drug side effect, electrolyte or metabolic abnormality, SBO/ileus, nonspecific gastritis.  Patient's presentation is most consistent with acute presentation with potential threat to life or bodily function.  Labs/studies ordered: CMP, CBC, lipase, EKG.  Interventions ordered: LR 1 L IV bolus, morphine 4 mg IV, Zofran 4 mg IV.  Patient's labs are notable for a lipase of over thousand, leukocytosis of 13.2, and a comprehensive metabolic panel with a mild AKI with a creatinine of 1.37 and elevated LFTs but a normal total bilirubin.  Acute pancreatitis appears to be the main issue but could be due to gallbladder issues as well, including cholelithiasis, cholecystitis, choledocholithiasis, etc.  In addition to the medication interventions listed above, I ordered a right upper quadrant ultrasound to further assess the possibility of biliary involvement.  We can also obtain a CT scan if necessary or or as indicated.  Patient does not meet sepsis criteria.  No indication for empiric antibiotics yet.  The patient will need to be admitted but the ultrasound will help determine the need for urgent surgical versus GI intervention.  Patient and wife understand and agree with the plan.   Clinical Course as of 07/13/22 0606  Sat Jul 13, 2022  0437 Consulting hospitalist for admission [CF]  0500 Consulted in person with Dr. Damita Dunnings with the hospitalist service.  She will evaluate and admit the patient. [CF]  0605 I viewed and interpreted the patient's ultrasound.   Cholelithiasis without evidence of cholecystitis. [CF]    Clinical Course User Index [CF] Hinda Kehr, MD     FINAL CLINICAL IMPRESSION(S) / ED DIAGNOSES   Final diagnoses:  Elevated LFTs  Acute kidney injury ([place])  Acute pancreatitis, unspecified complication status, unspecified pancreatitis type  Nausea and vomiting, unspecified vomiting type     Rx / DC Orders   ED Discharge Orders     None        Note:  This document was prepared using Dragon voice recognition  software and may include unintentional dictation errors.   Hinda Kehr, MD 07/13/22 [phone]

## 2022-07-13 NOTE — H&P (Addendum)
History and Physical    Patient: Sean Sexton LYY:[id number] DOB: 1961-05-19 DOA: 07/13/2022 DOS: the patient was seen and examined on 07/13/2022 PCP: Derinda Late, MD  Patient coming from: Home  Chief Complaint:  Chief Complaint  Patient presents with   Abdominal Pain    HPI: Sean Sexton is a 61 y.o. male with medical history significant for Hepatitis C s/p Harvoni x 12 weeks, HTN, history of adenomatous colon polyp, who presents to the ED with a 1 to 2-day history of abdominal pain in the mid upper abdomen associated with nonbloody nonbilious vomiting redescribed the vomitus is being a bit ' orange' in color.  He denies diarrhea or dysuria and had subjective fever.  He denies chest pain or shortness of breath.  States he has had intermittent episodes of a similar pain in the past by his always resolved spontaneously. ED course and data review: Vitals within normal limits.  Labs significant for lipase of 1042, AST 199, ALT 161, alk phos 128 and normal bili of 1.1.  WBC 13,000.  Creatinine 1.37, up from 1.2 in January 2023.  Acute hepatitis panel pending. EKG, personally reviewed and interval rhytid showing NSR at 77 with no ischemic ST-T wave changes. Imaging: Right upper quadrant ultrasound pending Patient treated with morphine and Zofran and given an LR bolus and hospitalist consulted for admission.   Review of Systems: As mentioned in the history of present illness. All other systems reviewed and are negative.  Past Medical History:  Diagnosis Date   Hepatitis    hept.c   Hypertension    Past Surgical History:  Procedure Laterality Date   COLONOSCOPY N/A 07/07/2015   Procedure: COLONOSCOPY;  Surgeon: Josefine Class, MD;  Location: [facility] ENDOSCOPY;  Service: Endoscopy;  Laterality: N/A;   TONSILLECTOMY     Social History:  reports that he has quit smoking. He does not have any smokeless tobacco history on file. He reports that he does not drink alcohol and does  not use drugs.  No Known Allergies  No family history on file.  Prior to Admission medications   Medication Sig Start Date End Date Taking? Authorizing Provider  aspirin EC 81 MG tablet Take 81 mg by mouth daily.    [provider]  lisinopril-hydrochlorothiazide (PRINZIDE,ZESTORETIC) 20-12.5 MG tablet Take 1 tablet by mouth daily.    [provider]  Multiple Vitamin (MULTIVITAMIN) tablet Take 1 tablet by mouth daily.    [provider]    Physical Exam: Vitals:   07/12/22 2138 07/12/22 2148 07/13/22 0248 07/13/22 0415  BP: 139/79  (!) 140/87 (!) 160/95  Pulse: 82  83 71  Resp: _0 Temp: 98.3 F (36.8 C)  98.8 F (37.1 C)   TempSrc: Oral  Oral   SpO2: 97%  97% 100%  Weight: 90.7 kg 90.7 kg    Height: 6' (1.829 m)      Physical Exam Vitals and nursing note reviewed.  Constitutional:      General: He is not in acute distress. HENT:     Head: Normocephalic and atraumatic.  Cardiovascular:     Rate and Rhythm: Normal rate and regular rhythm.     Heart sounds: Normal heart sounds.  Pulmonary:     Effort: Pulmonary effort is normal.     Breath sounds: Normal breath sounds.  Abdominal:     Palpations: Abdomen is soft.     Tenderness: There is abdominal tenderness in the epigastric area.  Neurological:  Mental Status: Mental status is at baseline.     Labs on Admission: I have personally reviewed following labs and imaging studies  CBC: Recent Labs  Lab 07/12/22 2139  WBC 13.2*  HGB 16.0  HCT 46.6  MCV 103.1*  PLT 676   Basic Metabolic Panel: Recent Labs  Lab 07/12/22 2139  NA 137  K 3.8  CL 102  CO2 27  GLUCOSE 143*  BUN 26*  CREATININE 1.37*  CALCIUM 8.9   GFR: Estimated Creatinine Clearance: 60.6 mL/min (A) (by C-G formula based on SCr of 1.37 mg/dL (H)). Liver Function Tests: Recent Labs  Lab 07/12/22 2139  AST 199*  ALT 161*  ALKPHOS 128*  BILITOT 1.1  PROT 7.6  ALBUMIN 3.7   Recent Labs  Lab  07/12/22 2139  LIPASE 1,042*   No results for input(s): "AMMONIA" in the last 168 hours. Coagulation Profile: No results for input(s): "INR", "PROTIME" in the last 168 hours. Cardiac Enzymes: No results for input(s): "CKTOTAL", "CKMB", "CKMBINDEX", "TROPONINI" in the last 168 hours. BNP (last 3 results) No results for input(s): "PROBNP" in the last 8760 hours. HbA1C: No results for input(s): "HGBA1C" in the last 72 hours. CBG: No results for input(s): "GLUCAP" in the last 168 hours. Lipid Profile: No results for input(s): "CHOL", "HDL", "LDLCALC", "TRIG", "CHOLHDL", "LDLDIRECT" in the last 72 hours. Thyroid Function Tests: No results for input(s): "TSH", "T4TOTAL", "FREET4", "T3FREE", "THYROIDAB" in the last 72 hours. Anemia Panel: No results for input(s): "VITAMINB12", "FOLATE", "FERRITIN", "TIBC", "IRON", "RETICCTPCT" in the last 72 hours. Urine analysis: No results found for: "COLORURINE", "APPEARANCEUR", "LABSPEC", "PHURINE", "GLUCOSEU", "HGBUR", "BILIRUBINUR", "KETONESUR", "PROTEINUR", "UROBILINOGEN", "NITRITE", "LEUKOCYTESUR"  Radiological Exams on Admission: No results found.   Data Reviewed: Relevant notes from primary care and specialist visits, past discharge summaries as available in EHR, including Care Everywhere. Prior diagnostic testing as pertinent to current admission diagnoses Updated medications and problem lists for reconciliation ED course, including vitals, labs, imaging, treatment and response to treatment Triage notes, nursing and pharmacy notes and ED provider's notes Notable results as noted in HPI   Assessment and Plan: * Acute pancreatitis Suspect gallstone pancreatitis lipase of 1042, AST 199, ALT 161, alk phos 128 and normal bili of 1.1. Patient denies alcohol use Right upper quadrant ultrasound pending at this time IV antiemetics, IV pain meds and IV fluids Follow-up ultrasound and if suggestive of gallstone pancreatitis will need evaluation  for ERCP GI consult Keep n.p.o. for possible procedure   History of hepatitis C Completed prior 12-week treatment with Harvoni  Benign essential hypertension Hydralazine IV as needed while NPO        DVT prophylaxis: SCD  Consults: GI, Dr. Allen Norris  Advance Care Planning: full code  Family Communication: Wife at bedside  Disposition Plan: Back to previous home environment  Severity of Illness: The appropriate patient status for this patient is INPATIENT. Inpatient status is judged to be reasonable and necessary in order to provide the required intensity of service to ensure the patient's safety. The patient's presenting symptoms, physical exam findings, and initial radiographic and laboratory data in the context of their chronic comorbidities is felt to place them at high risk for further clinical deterioration. Furthermore, it is not anticipated that the patient will be medically stable for discharge from the hospital within 2 midnights of admission.   * I certify that at the point of admission it is my clinical judgment that the patient will require inpatient hospital care spanning beyond 2 midnights from  the point of admission due to high intensity of service, high risk for further deterioration and high frequency of surveillance required.*  Author: Athena Masse, MD 07/13/2022 5:06 AM  For on call review [email or web address].

## 2022-07-13 NOTE — Assessment & Plan Note (Addendum)
Suspect gallstone pancreatitis lipase of 1042, AST 199, ALT 161, alk phos 128 and normal bili of 1.1. Patient denies alcohol use Right upper quadrant ultrasound pending at this time IV antiemetics, IV pain meds and IV fluids Follow-up ultrasound and if suggestive of gallstone pancreatitis will need evaluation for ERCP GI consult Keep n.p.o. for possible procedure

## 2022-07-13 NOTE — Assessment & Plan Note (Signed)
Completed prior 12-week treatment with Harvoni

## 2022-07-13 NOTE — Consult Note (Signed)
Subjective:   CC: Gallstone pancreatitis  HPI:  Sean Sexton is a 61 y.o. male who is consulted by Billie Ruddy For evaluation of above cc.  Symptoms were first noted 2 day ago. Pain is sharp, epigastric.  Associated with nausea, exacerbated by nothing specific.  Similar episodes in past but not to this degree     Past Medical History:  has a past medical history of Hepatitis and Hypertension.  Past Surgical History:  has a past surgical history that includes Tonsillectomy and Colonoscopy (N/A, 07/07/2015).  Family History: family history is not on file.  Social History:  reports that he has quit smoking. He does not have any smokeless tobacco history on file. He reports that he does not drink alcohol and does not use drugs.  Current Medications:  Prior to Admission medications   Medication Sig Start Date End Date Taking? Authorizing Provider  aspirin EC 81 MG tablet Take 81 mg by mouth daily.    [provider]  lisinopril-hydrochlorothiazide (PRINZIDE,ZESTORETIC) 20-12.5 MG tablet Take 1 tablet by mouth daily.    [provider]  Multiple Vitamin (MULTIVITAMIN) tablet Take 1 tablet by mouth daily.    [provider]  pantoprazole (PROTONIX) 20 MG tablet Take 20 mg by mouth daily.    [provider]  sildenafil (REVATIO) 20 MG tablet Take 60 mg by mouth daily as needed.    [provider]    Allergies:  Allergies as of 07/12/2022   (No Known Allergies)    ROS:  General: Denies weight loss, weight gain, fatigue, fevers, chills, and night sweats. Eyes: Denies blurry vision, double vision, eye pain, itchy eyes, and tearing. Ears: Denies hearing loss, earache, and ringing in ears. Nose: Denies sinus pain, congestion, infections, runny nose, and nosebleeds. Mouth/throat: Denies hoarseness, sore throat, bleeding gums, and difficulty swallowing. Heart: Denies chest pain, palpitations, racing heart, irregular heartbeat, leg pain or swelling, and  decreased activity tolerance. Respiratory: Denies breathing difficulty, shortness of breath, wheezing, cough, and sputum. GI: Denies change in appetite, heartburn, nausea, vomiting, constipation, diarrhea, and blood in stool. GU: Denies difficulty urinating, pain with urinating, urgency, frequency, blood in urine. Musculoskeletal: Denies joint stiffness, pain, swelling, muscle weakness. Skin: Denies rash, itching, mass, tumors, sores, and boils Neurologic: Denies headache, fainting, dizziness, seizures, numbness, and tingling. Psychiatric: Denies depression, anxiety, difficulty sleeping, and memory loss. Endocrine: Denies heat or cold intolerance, and increased thirst or urination. Blood/lymph: Denies easy bruising, and swollen glands     Objective:     BP (!) 147/89   Pulse 73   Temp 97.7 F (36.5 C) (Oral)   Resp 18   Ht 6' (1.829 m)   Wt 90.7 kg   SpO2 96%   BMI 27.12 kg/m    Constitutional :  alert, cooperative, appears stated age, and no distress  Lymphatics/Throat:  no asymmetry, masses, or scars  Respiratory:  clear to auscultation bilaterally  Cardiovascular:  regular rate and rhythm  Gastrointestinal: Soft, no guarding, tenderness to palpation epigastric region .   Musculoskeletal: Steady movement  Skin: Cool and moist surgical scars  Psychiatric: Normal affect, non-agitated, not confused       LABS:     Latest Ref Rng & Units 07/12/2022    9:39 PM  CMP  Glucose 70 - 99 mg/dL 143   BUN 8 - 23 mg/dL 26   Creatinine 0.61 - 1.24 mg/dL 1.37   Sodium 135 - 145 mmol/L 137   Potassium 3.5 - 5.1 mmol/L 3.8  Chloride 98 - 111 mmol/L 102   CO2 22 - 32 mmol/L 27   Calcium 8.9 - 10.3 mg/dL 8.9   Total Protein 6.5 - 8.1 g/dL 7.6   Total Bilirubin 0.3 - 1.2 mg/dL 1.1   Alkaline Phos 38 - 126 U/L 128   AST 15 - 41 U/L 199   ALT 0 - 44 U/L 161       Latest Ref Rng & Units 07/12/2022    9:39 PM  CBC  WBC 4.0 - 10.5 K/uL 13.2   Hemoglobin 13.0 - 17.0 g/dL 16.0    Hematocrit 39.0 - 52.0 % 46.6   Platelets 150 - 400 K/uL 253      RADS: CLINICAL DATA:  61 year old male with abnormal lipase. Query gallstone pancreatitis. History of chronic hepatitis C.   EXAM: ULTRASOUND ABDOMEN LIMITED RIGHT UPPER QUADRANT   COMPARISON:  Abdomen ultrasound 07/21/2015.   FINDINGS: Gallbladder:   Shadowing echogenic gallstones individually estimated up to 13 mm. Gallbladder wall thickness is at the upper limits of normal, 2-3 mm. No pericholecystic fluid. No sonographic Murphy sign elicited.   Common bile duct:   Diameter: 4 mm, normal.   Liver:   Background liver echogenicity appears normal. Mildly nodular liver contour suggested on multiple images (image 51). No discrete liver lesion. Portal vein is patent on color Doppler imaging with normal direction of blood flow towards the liver.   Other: Questionable trace perihepatic fluid on image 51. Grossly negative visible right kidney.   IMPRESSION: 1. Cholelithiasis. But no evidence of acute cholecystitis or bile duct obstruction. 2. Mildly nodular liver contour suspicious for Cirrhosis. Possible trace ascites. No discrete liver lesion by ultrasound.     Electronically Signed   By: Genevie Ann M.D.   On: 07/13/2022 05:46   Assessment:      Gallstone pancreatitis-recommend proceeding with robotic assisted lap for scopic cholecystectomy during this admission to prevent recurrence.  Plan:      Discussed the risk of surgery including post-op infxn, seroma, biloma, chronic pain, poor-delayed wound healing, retained gallstone, conversion to open procedure, post-op SBO or ileus, and need for additional procedures to address said risks.  The risks of general anesthetic including MI, CVA, sudden death or even reaction to anesthetic medications also discussed. Alternatives include continued observation.  Benefits include possible symptom relief, prevention of complications including acute cholecystitis,  pancreatitis.  Typical post operative recovery of 3-5 days rest, continued pain in area and incision sites, possible loose stools up to 4-6 weeks, also discussed.  The patient understands the risks, any and all questions were answered to the patient's satisfaction.  To OR in the next day or so depending on patient symptom improvement.  labs/images/medications/previous chart entries reviewed personally and relevant changes/updates noted above.

## 2022-07-14 ENCOUNTER — Inpatient Hospital Stay: Payer: 59

## 2022-07-14 DIAGNOSIS — K8591 Acute pancreatitis with uninfected necrosis, unspecified: Secondary | ICD-10-CM

## 2022-07-14 LAB — CBC
HCT: 43.6 % (ref 39.0–52.0)
Hemoglobin: 15.3 g/dL (ref 13.0–17.0)
MCH: 35.6 pg — ABNORMAL HIGH (ref 26.0–34.0)
MCHC: 35.1 g/dL (ref 30.0–36.0)
MCV: 101.4 fL — ABNORMAL HIGH (ref 80.0–100.0)
Platelets: 204 10*3/uL (ref 150–400)
RBC: 4.3 MIL/uL (ref 4.22–5.81)
RDW: 13.1 % (ref 11.5–15.5)
WBC: 24 10*3/uL — ABNORMAL HIGH (ref 4.0–10.5)
nRBC: 0 % (ref 0.0–0.2)

## 2022-07-14 LAB — BASIC METABOLIC PANEL
Anion gap: 5 (ref 5–15)
BUN: 24 mg/dL — ABNORMAL HIGH (ref 8–23)
CO2: 27 mmol/L (ref 22–32)
Calcium: 7.9 mg/dL — ABNORMAL LOW (ref 8.9–10.3)
Chloride: 105 mmol/L (ref 98–111)
Creatinine, Ser: 1.17 mg/dL (ref 0.61–1.24)
GFR, Estimated: >60 mL/min (ref >60)
Glucose, Bld: 103 mg/dL — ABNORMAL HIGH (ref 70–99)
Potassium: 3.8 mmol/L (ref 3.5–5.1)
Sodium: 137 mmol/L (ref 135–145)

## 2022-07-14 LAB — MAGNESIUM: Magnesium: 1.8 mg/dL (ref 1.7–2.4)

## 2022-07-14 LAB — LIPASE, BLOOD: Lipase: 99 U/L — ABNORMAL HIGH (ref 11–51)

## 2022-07-14 MED ORDER — IOHEXOL 350 MG/ML SOLN
80.0000 mL | Freq: Once | INTRAVENOUS | Status: AC | PRN
  Administered 2022-07-14: 80 mL via INTRAVENOUS

## 2022-07-14 MED ORDER — PIPERACILLIN-TAZOBACTAM 3.375 G IVPB
3.3750 g | Freq: Three times a day (TID) | INTRAVENOUS | Status: DC
  Administered 2022-07-14 – 2022-07-21 (×21): 3.375 g via INTRAVENOUS
  Filled 2022-07-14 (×20): qty 50

## 2022-07-14 NOTE — Progress Notes (Signed)
Sean Lame, MD [name]   [address] [place], [place] [address] Phone: [phone] Fax : [phone]   Subjective: The patient's lipase had come down dramatically since admission but the patient states that his abdominal pain is still severe.  The patient had a CT scan showing signs of necrosis of the pancreas with the patient's white cell count elevating overnight from 13-24 overnight.  The patient reports that he does not drink alcohol and has not drank for 30 years and this is collaborated by his son and his wife.  The patient's MCV remains high and his liver enzymes have been elevated.  There is no report of any ductal dilatation.   Objective: Vital signs in last 24 hours: Vitals:   07/13/22 1200 07/13/22 1734 07/13/22 2110 07/14/22 0509  BP: (!) 147/89 117/64 125/75 (!) 138/90  Pulse: 73 86 87 79  Resp:  '18 16 16  '$ Temp: 97.7 F (36.5 C) 98.1 F (36.7 C) 99.3 F (37.4 C) 99.2 F (37.3 C)  TempSrc: Oral Oral Oral Oral  SpO2: 96% 95% 93% 96%  Weight:      Height:       Weight change:   Intake/Output Summary (Last 24 hours) at 07/14/2022 1421 Last data filed at 07/14/2022 0600 Gross per 24 hour  Intake 1000 ml  Output 600 ml  Net 400 ml     Exam: General: The patient laying in bed and does not appear uncomfortable.  He is speaking in full sentences.     Lab Results: '@LABTEST2'$ @ Micro Results: No results found for this or any previous visit (from the past 240 hour(s)). Studies/Results: CT ABDOMEN PELVIS W CONTRAST  Result Date: 07/14/2022 CLINICAL DATA:  Abdominal pain. EXAM: CT ABDOMEN AND PELVIS WITH CONTRAST TECHNIQUE: Multidetector CT imaging of the abdomen and pelvis was performed using the standard protocol following bolus administration of intravenous contrast. RADIATION DOSE REDUCTION: This exam was performed according to the departmental dose-optimization program which includes automated exposure control, adjustment of the mA and/or kV  according to patient size and/or use of iterative reconstruction technique. CONTRAST:  69m OMNIPAQUE IOHEXOL 350 MG/ML SOLN COMPARISON:  Previous abdomen ultrasounds, most recent 07/13/2022. FINDINGS: Lower chest: Trace effusions and dependent lower lobe atelectasis. No acute findings. Hepatobiliary: 3 subcentimeter low-attenuation liver lesions. 6 mm lesion at the dome of the right lobe, smaller low-attenuation lesion from the lateral margin of the left lobe and tiny lesion in the central right lobe, all consistent with cysts. Liver demonstrates central volume loss. This raises possibility of cirrhosis as was suggested on the recent ultrasound. No other liver abnormality. Multiple gallstones. No gallbladder wall thickening or evidence of acute cholecystitis. No bile duct dilation. Pancreas: Peripancreatic inflammatory changes and fluid. There is lack of enhancement at the level of the pancreatic neck and body consistent with necrosis. Remainder of the pancreas demonstrates normal enhancement. No mass. No duct dilation. No defined peripancreatic fluid collection. Spleen: Normal in size without focal abnormality. Adrenals/Urinary Tract: Normal adrenal glands. Kidneys normal size, orientation and position with symmetric enhancement and excretion. Small nonobstructing stone, upper pole the left kidney. No renal masses. No hydronephrosis. Normal ureters. Normal bladder. Stomach/Bowel: Decompressed stomach, otherwise unremarkable. Small bowel and colon are normal in caliber. No wall thickening. No inflammation. Normal appendix visualized. Vascular/Lymphatic: Minimal aortic atherosclerosis. No aneurysm. Several prominent gastrohepatic ligament lymph nodes, all subcentimeter in short axis. Reproductive: Prominent prostate, 4.1 x 3.8 cm transversely. Other: Small amount of ascites collects adjacent to the liver  and spleen, lies adjacent to the pancreas and extends along the small bowel mesentery, as well as collecting in  the pelvis. Musculoskeletal: No fracture or acute finding.  No bone lesion. IMPRESSION: 1. Acute pancreatitis complicated by necrosis at the level of the pancreatic neck and body. No defined peripancreatic fluid collection. There is associated peripancreatic inflammation as well as a small amount of ascites. 2. Multiple gallstones. No evidence of acute cholecystitis. No visualized duct stone and no duct dilation. Electronically Signed   By: Lajean Manes M.D.   On: 07/14/2022 13:06   US ABDOMEN LIMITED RUQ (LIVER/GB)  Result Date: 07/13/2022 CLINICAL DATA:  61 year old male with abnormal lipase. Query gallstone pancreatitis. History of chronic hepatitis C. EXAM: ULTRASOUND ABDOMEN LIMITED RIGHT UPPER QUADRANT COMPARISON:  Abdomen ultrasound 07/21/2015. FINDINGS: Gallbladder: Shadowing echogenic gallstones individually estimated up to 13 mm. Gallbladder wall thickness is at the upper limits of normal, 2-3 mm. No pericholecystic fluid. No sonographic Murphy sign elicited. Common bile duct: Diameter: 4 mm, normal. Liver: Background liver echogenicity appears normal. Mildly nodular liver contour suggested on multiple images (image 51). No discrete liver lesion. Portal vein is patent on color Doppler imaging with normal direction of blood flow towards the liver. Other: Questionable trace perihepatic fluid on image 51. Grossly negative visible right kidney. IMPRESSION: 1. Cholelithiasis. But no evidence of acute cholecystitis or bile duct obstruction. 2. Mildly nodular liver contour suspicious for Cirrhosis. Possible trace ascites. No discrete liver lesion by ultrasound. Electronically Signed   By: Genevie Ann M.D.   On: 07/13/2022 05:46   Medications: I have reviewed the patient's current medications. Scheduled Meds: Continuous Infusions:  lactated ringers 125 mL/hr at 07/13/22 0540   piperacillin-tazobactam (ZOSYN)  IV     PRN Meds:.acetaminophen **OR** acetaminophen, hydrALAZINE, morphine injection, ondansetron  **OR** ondansetron (ZOFRAN) IV   Assessment: Principal Problem:   Acute pancreatitis Active Problems:   Benign essential hypertension   History of hepatitis C   Gall stone pancreatitis    Plan: This patient has acute pancreatitis with necrosis and an elevated white cell count.  I also believe that IV antibiotics are warranted since the patient's white cell count has dramatically increased.  I recommend the patient have a ID consult.  I would have a low threshold for this patient to be transferred to a tertiary care center due to his necrotizing pancreatitis if he should deteriorate whatsoever.  Due to the high risk of translocation of bacteria across the abdominal wall causing a sterile pancreatic necrosis to turn into  infected necrosis it is recommended the patient be taking in p.o.'s.  Dr. Marius Ditch will be taking over the service tomorrow.  Sean Sexton, FACG 07/14/2022, 2:21 PM Pager [phone] 7am-5pm  Check AMION for 5pm -7am coverage and on weekends

## 2022-07-14 NOTE — Progress Notes (Signed)
Subjective:  CC: Sean Sexton is a 61 y.o. male  Hospital stay day 1,   gallstone necrotizing pancreatitis  HPI: No acute worsening overnight.  Patient states pain is still present.  ROS:  General: Denies weight loss, weight gain, fatigue, fevers, chills, and night sweats. Heart: Denies chest pain, palpitations, racing heart, irregular heartbeat, leg pain or swelling, and decreased activity tolerance. Respiratory: Denies breathing difficulty, shortness of breath, wheezing, cough, and sputum. GI: Denies change in appetite, heartburn, nausea, vomiting, constipation, diarrhea, and blood in stool. GU: Denies difficulty urinating, pain with urinating, urgency, frequency, blood in urine.   Objective:   Temp:  [98.1 F (36.7 C)-99.3 F (37.4 C)] 99.2 F (37.3 C) (12/10 0509) Pulse Rate:  [79-87] 79 (12/10 0509) Resp:  [16-18] 16 (12/10 0509) BP: (117-138)/(64-90) 138/90 (12/10 0509) SpO2:  [93 %-96 %] 96 % (12/10 0509)     Height: 6' (182.9 cm) Weight: 90.7 kg BMI (Calculated): 27.12   Intake/Output this shift:   Intake/Output Summary (Last 24 hours) at 07/14/2022 1440 Last data filed at 07/14/2022 0600 Gross per 24 hour  Intake 1000 ml  Output 600 ml  Net 400 ml    Constitutional :  alert, cooperative, appears stated age, and no distress  Respiratory:  clear to auscultation bilaterally  Cardiovascular:  regular rate and rhythm  Gastrointestinal: Soft, focal guarding in epigastric region, unchanged from previous exam .   Skin: Cool and moist.   Psychiatric: Normal affect, non-agitated, not confused       LABS:     Latest Ref Rng & Units 07/14/2022    5:46 AM 07/13/2022    3:15 PM 07/12/2022    9:39 PM  CMP  Glucose 70 - 99 mg/dL 103  93  143   BUN 8 - 23 mg/dL '24  24  26   '$ Creatinine 0.61 - 1.24 mg/dL 1.17  1.14  1.37   Sodium 135 - 145 mmol/L 137  138  137   Potassium 3.5 - 5.1 mmol/L 3.8  3.7  3.8   Chloride 98 - 111 mmol/L 105  105  102   CO2 22 - 32 mmol/L '27   27  27   '$ Calcium 8.9 - 10.3 mg/dL 7.9  8.3  8.9   Total Protein 6.5 - 8.1 g/dL  6.9  7.6   Total Bilirubin 0.3 - 1.2 mg/dL  1.3  1.1   Alkaline Phos 38 - 126 U/L  97  128   AST 15 - 41 U/L  86  199   ALT 0 - 44 U/L  103  161       Latest Ref Rng & Units 07/14/2022    5:46 AM 07/12/2022    9:39 PM  CBC  WBC 4.0 - 10.5 K/uL 24.0  13.2   Hemoglobin 13.0 - 17.0 g/dL 15.3  16.0   Hematocrit 39.0 - 52.0 % 43.6  46.6   Platelets 150 - 400 K/uL 204  253     RADS: CLINICAL DATA:  Abdominal pain.   EXAM: CT ABDOMEN AND PELVIS WITH CONTRAST   TECHNIQUE: Multidetector CT imaging of the abdomen and pelvis was performed using the standard protocol following bolus administration of intravenous contrast.   RADIATION DOSE REDUCTION: This exam was performed according to the departmental dose-optimization program which includes automated exposure control, adjustment of the mA and/or kV according to patient size and/or use of iterative reconstruction technique.   CONTRAST:  65m OMNIPAQUE IOHEXOL 350 MG/ML SOLN  COMPARISON:  Previous abdomen ultrasounds, most recent 07/13/2022.   FINDINGS: Lower chest: Trace effusions and dependent lower lobe atelectasis. No acute findings.   Hepatobiliary: 3 subcentimeter low-attenuation liver lesions. 6 mm lesion at the dome of the right lobe, smaller low-attenuation lesion from the lateral margin of the left lobe and tiny lesion in the central right lobe, all consistent with cysts. Liver demonstrates central volume loss. This raises possibility of cirrhosis as was suggested on the recent ultrasound. No other liver abnormality. Multiple gallstones. No gallbladder wall thickening or evidence of acute cholecystitis. No bile duct dilation.   Pancreas: Peripancreatic inflammatory changes and fluid. There is lack of enhancement at the level of the pancreatic neck and body consistent with necrosis. Remainder of the pancreas demonstrates normal  enhancement. No mass. No duct dilation. No defined peripancreatic fluid collection.   Spleen: Normal in size without focal abnormality.   Adrenals/Urinary Tract: Normal adrenal glands. Kidneys normal size, orientation and position with symmetric enhancement and excretion. Small nonobstructing stone, upper pole the left kidney. No renal masses. No hydronephrosis. Normal ureters. Normal bladder.   Stomach/Bowel: Decompressed stomach, otherwise unremarkable. Small bowel and colon are normal in caliber. No wall thickening. No inflammation. Normal appendix visualized.   Vascular/Lymphatic: Minimal aortic atherosclerosis. No aneurysm. Several prominent gastrohepatic ligament lymph nodes, all subcentimeter in short axis.   Reproductive: Prominent prostate, 4.1 x 3.8 cm transversely.   Other: Small amount of ascites collects adjacent to the liver and spleen, lies adjacent to the pancreas and extends along the small bowel mesentery, as well as collecting in the pelvis.   Musculoskeletal: No fracture or acute finding.  No bone lesion.   IMPRESSION: 1. Acute pancreatitis complicated by necrosis at the level of the pancreatic neck and body. No defined peripancreatic fluid collection. There is associated peripancreatic inflammation as well as a small amount of ascites. 2. Multiple gallstones. No evidence of acute cholecystitis. No visualized duct stone and no duct dilation.     Electronically Signed   By: Lajean Manes M.D.   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  Increasing leukocytosis noted this morning so CT scan obtained with results as above.  Patient pain largely remains unchanged, and vitals otherwise hemodynamically stable.  Hospitalist team started patient on antibiotics.  Will continue to monitor overnight and repeat labs in the a.m.  If labs worsen or clinical exam worsens, recommend transfer to tertiary care center for  closer observation and further intervention if needed, since necrosectomy's or any invasive procedure for pancreatitis will be beyond the scope of practice here at [facility].  Case discussed with hospitalist team and also with patient and family.  Will delay any surgical intervention for laparoscopic cholecystectomy until pancreatitis improves. labs/images/medications/previous chart entries reviewed personally and relevant changes/updates noted above.

## 2022-07-14 NOTE — Plan of Care (Signed)
Patient axox4, takes meds whole, ambulates independently. PRN pain meds given x3 on shift today. Diet advanced to clear liquids today.  No additional educational needs noted at this time.  Problem: Education: Goal: Knowledge of General Education information will improve Description: Including pain rating scale, medication(s)/side effects and non-pharmacologic comfort measures Outcome: Progressing   Problem: Health Behavior/Discharge Planning: Goal: Ability to manage health-related needs will improve Outcome: Progressing   Problem: Clinical Measurements: Goal: Ability to maintain clinical measurements within normal limits will improve Outcome: Progressing Goal: Will remain free from infection Outcome: Progressing Goal: Diagnostic test results will improve Outcome: Progressing Goal: Respiratory complications will improve Outcome: Progressing Goal: Cardiovascular complication will be avoided Outcome: Progressing   Problem: Activity: Goal: Risk for activity intolerance will decrease Outcome: Progressing   Problem: Nutrition: Goal: Adequate nutrition will be maintained Outcome: Progressing   Problem: Coping: Goal: Level of anxiety will decrease Outcome: Progressing   Problem: Elimination: Goal: Will not experience complications related to bowel motility Outcome: Progressing Goal: Will not experience complications related to urinary retention Outcome: Progressing   Problem: Pain Managment: Goal: General experience of comfort will improve Outcome: Progressing   Problem: Safety: Goal: Ability to remain free from injury will improve Outcome: Progressing   Problem: Skin Integrity: Goal: Risk for impaired skin integrity will decrease Outcome: Progressing

## 2022-07-14 NOTE — Progress Notes (Signed)
  PROGRESS NOTE    MALEKE Sexton  RXV:[id number] DOB: 01-29-1961 DOA: 07/13/2022 PCP: Derinda Late, MD  [id number]  LOS: 1 day   Brief hospital course:   Assessment & Plan: Sean Sexton is a 61 y.o. male with medical history significant for Hepatitis C s/p Harvoni x 12 weeks, HTN, history of adenomatous colon polyp, who presents to the ED with a 1 to 2-day history of abdominal pain in the mid upper abdomen associated with nonbloody nonbilious vomiting.   * Acute gallstone pancreatitis with necrosis  lipase of 1042 on presentation, trending down now. Patient denies alcohol use Right upper quadrant ultrasound showed Cholelithiasis. But no evidence of acute cholecystitis or bile duct obstruction. --due to elevated WBC, CT a/p obtained which showed necrosis at the level of the pancreatic neck and body. --GI consulted on admission.  GenSurg consulted the next day for possible cholecystectomy.   Plan: --cont MIVF'@125'$  --start zosyn --if pt decompensates, will need to transfer to tertiary center for possible necrosectomies, per GI rec.  History of hepatitis C Completed prior 12-week treatment with Harvoni   Benign essential hypertension --hold home lisinopril-hydrochlorothiazide    DVT prophylaxis: SCD/Compression stockings Code Status: Full code  Family Communication: wife updated at bedside today Level of care: Med-Surg Dispo:   The patient is from: home Anticipated d/c is to: home Anticipated d/c date is: >3 days   Subjective and Interval History:  Pt reported abdominal pain improved, however, WBC went up, so CT a/p obtained which showed necrosis at the level of the pancreatic neck and body.   Objective: Vitals:   07/13/22 1200 07/13/22 1734 07/13/22 2110 07/14/22 0509  BP: (!) 147/89 117/64 125/75 (!) 138/90  Pulse: 73 86 87 79  Resp:  '18 16 16  '$ Temp: 97.7 F (36.5 C) 98.1 F (36.7 C) 99.3 F (37.4 C) 99.2 F (37.3 C)  TempSrc: Oral Oral Oral Oral   SpO2: 96% 95% 93% 96%  Weight:      Height:        Intake/Output Summary (Last 24 hours) at 07/14/2022 1502 Last data filed at 07/14/2022 0600 Gross per 24 hour  Intake 1000 ml  Output 600 ml  Net 400 ml   Filed Weights   07/12/22 2138 07/12/22 2148  Weight: 90.7 kg 90.7 kg    Examination:   Constitutional: NAD, AAOx3 HEENT: conjunctivae and lids normal, EOMI CV: No cyanosis.   RESP: normal respiratory effort, on RA Neuro: II - XII grossly intact.   Psych: Normal mood and affect.  Appropriate judgement and reason   Data Reviewed: I have personally reviewed labs and imaging studies  Time spent: 35 minutes  Enzo Bi, MD [facility] If 7PM-7AM, please contact night-coverage 07/14/2022, 3:02 PM

## 2022-07-14 NOTE — Progress Notes (Signed)
Pharmacy Antibiotic Note  Sean Sexton is a 61 y.o. male admitted on 07/13/2022 with  intraabdominal infection .  Pharmacy has been consulted for Zosyn (piperacillin/tazobactam) dosing.   Renal function at baseline. CrCl > 60 mL/min.  Plan:  Zosyn (piperacillin/tazobactam) IV 3.375 grams every 8 hours (4 hour infusion)  Height: 6' (182.9 cm) Weight: 90.7 kg (200 lb) IBW/kg (Calculated) : 77.6  Temp (24hrs), Avg:98.9 F (37.2 C), Min:98.1 F (36.7 C), Max:99.3 F (37.4 C)  Recent Labs  Lab 07/12/22 2139 07/13/22 1515 07/14/22 0546  WBC 13.2*  --  24.0*  CREATININE 1.37* 1.14 1.17    Estimated Creatinine Clearance: 70.9 mL/min (by C-G formula based on SCr of 1.17 mg/dL).    No Known Allergies  Antimicrobials this admission: Zosyn 12/10 >>   Dose adjustments this admission: N/a  Microbiology results: None  Thank you for allowing pharmacy to be a part of this patient's care.    Glean Salvo, PharmD, BCPS Clinical Pharmacist  07/14/2022 1:54 PM

## 2022-07-15 LAB — HEPATIC FUNCTION PANEL
ALT: 58 U/L — ABNORMAL HIGH (ref 0–44)
AST: 48 U/L — ABNORMAL HIGH (ref 15–41)
Albumin: 2.8 g/dL — ABNORMAL LOW (ref 3.5–5.0)
Alkaline Phosphatase: 89 U/L (ref 38–126)
Bilirubin, Direct: 0.3 mg/dL — ABNORMAL HIGH (ref 0.0–0.2)
Indirect Bilirubin: 1.1 mg/dL — ABNORMAL HIGH (ref 0.3–0.9)
Total Bilirubin: 1.4 mg/dL — ABNORMAL HIGH (ref 0.3–1.2)
Total Protein: 6.1 g/dL — ABNORMAL LOW (ref 6.5–8.1)

## 2022-07-15 LAB — BASIC METABOLIC PANEL
Anion gap: 8 (ref 5–15)
BUN: 21 mg/dL (ref 8–23)
CO2: 23 mmol/L (ref 22–32)
Calcium: 7.8 mg/dL — ABNORMAL LOW (ref 8.9–10.3)
Chloride: 104 mmol/L (ref 98–111)
Creatinine, Ser: 0.95 mg/dL (ref 0.61–1.24)
GFR, Estimated: >60 mL/min (ref >60)
Glucose, Bld: 100 mg/dL — ABNORMAL HIGH (ref 70–99)
Potassium: 3.9 mmol/L (ref 3.5–5.1)
Sodium: 135 mmol/L (ref 135–145)

## 2022-07-15 LAB — MAGNESIUM: Magnesium: 1.9 mg/dL (ref 1.7–2.4)

## 2022-07-15 LAB — CBC
HCT: 41.9 % (ref 39.0–52.0)
Hemoglobin: 14.8 g/dL (ref 13.0–17.0)
MCH: 35.7 pg — ABNORMAL HIGH (ref 26.0–34.0)
MCHC: 35.3 g/dL (ref 30.0–36.0)
MCV: 101 fL — ABNORMAL HIGH (ref 80.0–100.0)
Platelets: 196 10*3/uL (ref 150–400)
RBC: 4.15 MIL/uL — ABNORMAL LOW (ref 4.22–5.81)
RDW: 12.9 % (ref 11.5–15.5)
WBC: 22.3 10*3/uL — ABNORMAL HIGH (ref 4.0–10.5)
nRBC: 0 % (ref 0.0–0.2)

## 2022-07-15 NOTE — Progress Notes (Signed)
Subjective:  CC: Sean Sexton is a 61 y.o. male  Hospital stay day 2,   gallstone necrotizing pancreatitis  HPI: No acute worsening overnight.  Patient states pain is still present, but continues to improve.  ROS:  General: Denies weight loss, weight gain, fatigue, fevers, chills, and night sweats. Heart: Denies chest pain, palpitations, racing heart, irregular heartbeat, leg pain or swelling, and decreased activity tolerance. Respiratory: Denies breathing difficulty, shortness of breath, wheezing, cough, and sputum. GI: Denies change in appetite, heartburn, nausea, vomiting, constipation, diarrhea, and blood in stool. GU: Denies difficulty urinating, pain with urinating, urgency, frequency, blood in urine.   Objective:   Temp:  [98.5 F (36.9 C)-100.9 F (38.3 C)] 98.7 F (37.1 C) (12/11 1909) Pulse Rate:  [81-95] 91 (12/11 1909) Resp:  [16-18] 18 (12/11 1909) BP: (126-139)/(76-97) 132/77 (12/11 1909) SpO2:  [94 %-95 %] 95 % (12/11 1909)     Height: 6' (182.9 cm) Weight: 90.7 kg BMI (Calculated): 27.12   Intake/Output this shift:   Intake/Output Summary (Last 24 hours) at 07/15/2022 2118 Last data filed at 07/15/2022 1700 Gross per 24 hour  Intake 1757.2 ml  Output --  Net 1757.2 ml    Constitutional :  alert, cooperative, appears stated age, and no distress  Respiratory:  clear to auscultation bilaterally  Cardiovascular:  regular rate and rhythm  Gastrointestinal: Soft, focal guarding in epigastric region, improved from previous exam .   Skin: Cool and moist.   Psychiatric: Normal affect, non-agitated, not confused       LABS:     Latest Ref Rng & Units 07/15/2022    4:07 AM 07/14/2022    5:46 AM 07/13/2022    3:15 PM  CMP  Glucose 70 - 99 mg/dL 100  103  93   BUN 8 - 23 mg/dL '21  24  24   '$ Creatinine 0.61 - 1.24 mg/dL 0.95  1.17  1.14   Sodium 135 - 145 mmol/L 135  137  138   Potassium 3.5 - 5.1 mmol/L 3.9  3.8  3.7   Chloride 98 - 111 mmol/L 104  105   105   CO2 22 - 32 mmol/L '23  27  27   '$ Calcium 8.9 - 10.3 mg/dL 7.8  7.9  8.3   Total Protein 6.5 - 8.1 g/dL 6.1   6.9   Total Bilirubin 0.3 - 1.2 mg/dL 1.4   1.3   Alkaline Phos 38 - 126 U/L 89   97   AST 15 - 41 U/L 48   86   ALT 0 - 44 U/L 58   103       Latest Ref Rng & Units 07/15/2022    4:07 AM 07/14/2022    5:46 AM 07/12/2022    9:39 PM  CBC  WBC 4.0 - 10.5 K/uL 22.3  24.0  13.2   Hemoglobin 13.0 - 17.0 g/dL 14.8  15.3  16.0   Hematocrit 39.0 - 52.0 % 41.9  43.6  46.6   Platelets 150 - 400 K/uL 196  204  253     RADS: CLINICAL DATA:  Abdominal pain.   EXAM: CT ABDOMEN AND PELVIS WITH CONTRAST   TECHNIQUE: Multidetector CT imaging of the abdomen and pelvis was performed using the standard protocol following bolus administration of intravenous contrast.   RADIATION DOSE REDUCTION: This exam was performed according to the departmental dose-optimization program which includes automated exposure control, adjustment of the mA and/or kV according to patient size and/or  use of iterative reconstruction technique.   CONTRAST:  25m OMNIPAQUE IOHEXOL 350 MG/ML SOLN   COMPARISON:  Previous abdomen ultrasounds, most recent 07/13/2022.   FINDINGS: Lower chest: Trace effusions and dependent lower lobe atelectasis. No acute findings.   Hepatobiliary: 3 subcentimeter low-attenuation liver lesions. 6 mm lesion at the dome of the right lobe, smaller low-attenuation lesion from the lateral margin of the left lobe and tiny lesion in the central right lobe, all consistent with cysts. Liver demonstrates central volume loss. This raises possibility of cirrhosis as was suggested on the recent ultrasound. No other liver abnormality. Multiple gallstones. No gallbladder wall thickening or evidence of acute cholecystitis. No bile duct dilation.   Pancreas: Peripancreatic inflammatory changes and fluid. There is lack of enhancement at the level of the pancreatic neck and  body consistent with necrosis. Remainder of the pancreas demonstrates normal enhancement. No mass. No duct dilation. No defined peripancreatic fluid collection.   Spleen: Normal in size without focal abnormality.   Adrenals/Urinary Tract: Normal adrenal glands. Kidneys normal size, orientation and position with symmetric enhancement and excretion. Small nonobstructing stone, upper pole the left kidney. No renal masses. No hydronephrosis. Normal ureters. Normal bladder.   Stomach/Bowel: Decompressed stomach, otherwise unremarkable. Small bowel and colon are normal in caliber. No wall thickening. No inflammation. Normal appendix visualized.   Vascular/Lymphatic: Minimal aortic atherosclerosis. No aneurysm. Several prominent gastrohepatic ligament lymph nodes, all subcentimeter in short axis.   Reproductive: Prominent prostate, 4.1 x 3.8 cm transversely.   Other: Small amount of ascites collects adjacent to the liver and spleen, lies adjacent to the pancreas and extends along the small bowel mesentery, as well as collecting in the pelvis.   Musculoskeletal: No fracture or acute finding.  No bone lesion.   IMPRESSION: 1. Acute pancreatitis complicated by necrosis at the level of the pancreatic neck and body. No defined peripancreatic fluid collection. There is associated peripancreatic inflammation as well as a small amount of ascites. 2. Multiple gallstones. No evidence of acute cholecystitis. No visualized duct stone and no duct dilation.     Electronically Signed   By: DLajean ManesM.D.   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  Leukocytosis now decreasing and clinical exam improving as well.  Continue IV fluids per primary team.  Will continue to monitor overnight and repeat labs in the a.m.  If labs worsen or clinical exam worsens, recommend transfer to tertiary care center for closer observation and further  intervention if needed, since necrosectomy's or any invasive procedure for pancreatitis will be beyond the scope [facility]  If labs and clinical exam continues to improve, we will proceed with robotic assisted lap chole during this admission, possibly tomorrow.  labs/images/medications/previous chart entries reviewed personally and relevant changes/updates noted above.

## 2022-07-15 NOTE — Progress Notes (Signed)
Sean Darby, MD [address]  [place]  [place], [place] [address]  Main: [phone]  Fax: [phone] Pager: [phone]   Subjective: Patient did not undergo cholecystectomy today due to fever.  He is started on antibiotics due to fever and leukocytosis.  Blood cultures are drawn.  He reports epigastric discomfort but able to tolerate sips of liquids.  Denies any worsening with p.o. intake.  His wife is bedside.  He denies any nausea or vomiting.   Objective: Vital signs in last 24 hours: Vitals:   07/14/22 2020 07/15/22 0404 07/15/22 0748 07/15/22 1613  BP: 119/68 (!) 139/97 126/76 (!) 135/91  Pulse: 89 81 87 95  Resp: '20 16 16 18  '$ Temp: 99.4 F (37.4 C) 98.5 F (36.9 C) (!) 100.9 F (38.3 C) 99.8 F (37.7 C)  TempSrc: Oral Oral  Oral  SpO2: 93% 94% 95% 95%  Weight:      Height:       Weight change:   Intake/Output Summary (Last 24 hours) at 07/15/2022 1800 Last data filed at 07/15/2022 1700 Gross per 24 hour  Intake 1757.2 ml  Output --  Net 1757.2 ml    Exam: Heart:: Regular rate and rhythm, S1S2 present, or without murmur or extra heart sounds Lungs: normal and clear to auscultation Abdomen: Epigastric tenderness, no guarding or rigidity   Lab Results:    Latest Ref Rng & Units 07/15/2022    4:07 AM 07/14/2022    5:46 AM 07/12/2022    9:39 PM  CBC  WBC 4.0 - 10.5 K/uL 22.3  24.0  13.2   Hemoglobin 13.0 - 17.0 g/dL 14.8  15.3  16.0   Hematocrit 39.0 - 52.0 % 41.9  43.6  46.6   Platelets 150 - 400 K/uL 196  204  253       Latest Ref Rng & Units 07/15/2022    4:07 AM 07/14/2022    5:46 AM 07/13/2022    3:15 PM  CMP  Glucose 70 - 99 mg/dL 100  103  93   BUN 8 - 23 mg/dL '21  24  24   '$ Creatinine 0.61 - 1.24 mg/dL 0.95  1.17  1.14   Sodium 135 - 145 mmol/L 135  137  138   Potassium 3.5 - 5.1 mmol/L 3.9  3.8  3.7   Chloride 98 - 111 mmol/L 104  105  105   CO2 22 - 32 mmol/L '23  27  27   '$ Calcium 8.9 - 10.3 mg/dL 7.8  7.9  8.3    Total Protein 6.5 - 8.1 g/dL   6.9   Total Bilirubin 0.3 - 1.2 mg/dL   1.3   Alkaline Phos 38 - 126 U/L   97   AST 15 - 41 U/L   86   ALT 0 - 44 U/L   103     Micro Results: No results found for this or any previous visit (from the past 240 hour(s)). Studies/Results: CT ABDOMEN PELVIS W CONTRAST  Result Date: 07/14/2022 CLINICAL DATA:  Abdominal pain. EXAM: CT ABDOMEN AND PELVIS WITH CONTRAST TECHNIQUE: Multidetector CT imaging of the abdomen and pelvis was performed using the standard protocol following bolus administration of intravenous contrast. RADIATION DOSE REDUCTION: This exam was performed according to the departmental dose-optimization program which includes automated exposure control, adjustment of the mA and/or kV according to patient size and/or use of iterative reconstruction technique. CONTRAST:  51m OMNIPAQUE IOHEXOL 350 MG/ML SOLN COMPARISON:  Previous  abdomen ultrasounds, most recent 07/13/2022. FINDINGS: Lower chest: Trace effusions and dependent lower lobe atelectasis. No acute findings. Hepatobiliary: 3 subcentimeter low-attenuation liver lesions. 6 mm lesion at the dome of the right lobe, smaller low-attenuation lesion from the lateral margin of the left lobe and tiny lesion in the central right lobe, all consistent with cysts. Liver demonstrates central volume loss. This raises possibility of cirrhosis as was suggested on the recent ultrasound. No other liver abnormality. Multiple gallstones. No gallbladder wall thickening or evidence of acute cholecystitis. No bile duct dilation. Pancreas: Peripancreatic inflammatory changes and fluid. There is lack of enhancement at the level of the pancreatic neck and body consistent with necrosis. Remainder of the pancreas demonstrates normal enhancement. No mass. No duct dilation. No defined peripancreatic fluid collection. Spleen: Normal in size without focal abnormality. Adrenals/Urinary Tract: Normal adrenal glands. Kidneys normal size,  orientation and position with symmetric enhancement and excretion. Small nonobstructing stone, upper pole the left kidney. No renal masses. No hydronephrosis. Normal ureters. Normal bladder. Stomach/Bowel: Decompressed stomach, otherwise unremarkable. Small bowel and colon are normal in caliber. No wall thickening. No inflammation. Normal appendix visualized. Vascular/Lymphatic: Minimal aortic atherosclerosis. No aneurysm. Several prominent gastrohepatic ligament lymph nodes, all subcentimeter in short axis. Reproductive: Prominent prostate, 4.1 x 3.8 cm transversely. Other: Small amount of ascites collects adjacent to the liver and spleen, lies adjacent to the pancreas and extends along the small bowel mesentery, as well as collecting in the pelvis. Musculoskeletal: No fracture or acute finding.  No bone lesion. IMPRESSION: 1. Acute pancreatitis complicated by necrosis at the level of the pancreatic neck and body. No defined peripancreatic fluid collection. There is associated peripancreatic inflammation as well as a small amount of ascites. 2. Multiple gallstones. No evidence of acute cholecystitis. No visualized duct stone and no duct dilation. Electronically Signed   By: Lajean Manes M.D.   On: 07/14/2022 13:06   Medications: I have reviewed the patient's current medications. Prior to Admission:  Medications Prior to Admission  Medication Sig Dispense Refill Last Dose   aspirin EC 81 MG tablet Take 81 mg by mouth daily.   07/12/2022   lisinopril-hydrochlorothiazide (PRINZIDE,ZESTORETIC) 20-12.5 MG tablet Take 1 tablet by mouth daily.   07/12/2022   Multiple Vitamin (MULTIVITAMIN) tablet Take 1 tablet by mouth daily.   07/12/2022   pantoprazole (PROTONIX) 20 MG tablet Take 20 mg by mouth daily.   07/12/2022   sildenafil (REVATIO) 20 MG tablet Take 60 mg by mouth daily as needed.   07/12/2022 at prn   Scheduled: Continuous:  lactated ringers 125 mL/hr at 07/15/22 0357   piperacillin-tazobactam (ZOSYN)   IV 3.375 g (07/15/22 1359)   OFB:PZWCHENIDPOEU **OR** acetaminophen, hydrALAZINE, morphine injection, ondansetron **OR** ondansetron (ZOFRAN) IV Anti-infectives (From admission, onward)    Start     Dose/Rate Route Frequency Ordered Stop   07/14/22 1445  piperacillin-tazobactam (ZOSYN) IVPB 3.375 g        3.375 g 12.5 mL/hr over 240 Minutes Intravenous Every 8 hours 07/14/22 1353        Scheduled Meds: Continuous Infusions:  lactated ringers 125 mL/hr at 07/15/22 0357   piperacillin-tazobactam (ZOSYN)  IV 3.375 g (07/15/22 1359)   PRN Meds:.acetaminophen **OR** acetaminophen, hydrALAZINE, morphine injection, ondansetron **OR** ondansetron (ZOFRAN) IV   Assessment: Principal Problem:   Necrotizing pancreatitis Active Problems:   Benign essential hypertension   History of hepatitis C   Gall stone pancreatitis  61 year old male admitted with acute necrotizing gallstone pancreatitis   Plan: Acute necrotizing  gallstone pancreatitis: Patient is started on antibiotics due to leukocytosis and fever Blood cultures are in process If patient continues to remain febrile, recommend repeat CT with contrast Okay with clear liquid diet Definitive cholecystectomy per timing of general surgery Monitor electrolytes and replete as needed, monitor renal function closely   LOS: 2 days   Quierra Silverio 07/15/2022, 6:00 PM

## 2022-07-15 NOTE — Progress Notes (Signed)
  PROGRESS NOTE    Sean Sexton  QQI:[id number] DOB: 1961/04/26 DOA: 07/13/2022 PCP: Derinda Late, MD  [id number]/[id number]-AA  LOS: 2 days   Brief hospital course:   Assessment & Plan: Sean Sexton is a 61 y.o. male with medical history significant for Hepatitis C s/p Harvoni x 12 weeks, HTN, history of adenomatous colon polyp, who presents to the ED with a 1 to 2-day history of abdominal pain in the mid upper abdomen associated with nonbloody nonbilious vomiting.   * Acute gallstone pancreatitis with necrosis  lipase of 1042 on presentation, trending down now. Patient denies alcohol use Right upper quadrant ultrasound showed Cholelithiasis. But no evidence of acute cholecystitis or bile duct obstruction. --GI consulted on admission.  GenSurg consulted the next day for possible cholecystectomy.   --due to elevated WBC, CT a/p obtained which showed necrosis at the level of the pancreatic neck and body.  Pt started on zosyn. Plan: --cont MIVF'@125'$  --cont zosyn --if pt decompensates, will need to transfer to tertiary center for possible necrosectomies, per GI rec.  Sepsis 2/2 pancreatic necrosis --fever to 100.9 this morning, with leukocytosis --obtain blood cx (after already receiving zosyn for 1 day)  Elevated LFT's --likely due to pancreatitis and sepsis --monitor  History of hepatitis C Completed prior 12-week treatment with Harvoni   Benign essential hypertension --hold home lisinopril-hydrochlorothiazide    DVT prophylaxis: SCD/Compression stockings Code Status: Full code  Family Communication: family updated at bedside today Level of care: Med-Surg Dispo:   The patient is from: home Anticipated d/c is to: home Anticipated d/c date is: >3 days   Subjective and Interval History:  Pt had a mild fever this morning.  Reported pain improved, tolerating clear liquid.     Objective: Vitals:   07/15/22 0404 07/15/22 0748 07/15/22 1613 07/15/22 1909  BP: (!) 139/97  126/76 (!) 135/91 132/77  Pulse: 81 87 95 91  Resp: '16 16 18 18  '$ Temp: 98.5 F (36.9 C) (!) 100.9 F (38.3 C) 99.8 F (37.7 C) 98.7 F (37.1 C)  TempSrc: Oral  Oral Oral  SpO2: 94% 95% 95% 95%  Weight:      Height:        Intake/Output Summary (Last 24 hours) at 07/15/2022 1947 Last data filed at 07/15/2022 1700 Gross per 24 hour  Intake 1757.2 ml  Output --  Net 1757.2 ml   Filed Weights   07/12/22 2138 07/12/22 2148  Weight: 90.7 kg 90.7 kg    Examination:   Constitutional: NAD, AAOx3 HEENT: conjunctivae and lids normal, EOMI CV: No cyanosis.   RESP: normal respiratory effort, on RA Neuro: II - XII grossly intact.   Psych: Normal mood and affect.  Appropriate judgement and reason   Data Reviewed: I have personally reviewed labs and imaging studies  Time spent: 35 minutes  Enzo Bi, MD [facility] If 7PM-7AM, please contact night-coverage 07/15/2022, 7:47 PM

## 2022-07-15 NOTE — TOC Initial Note (Signed)
Transition of Care [facility]) - Initial/Assessment Note    Patient Details  Name: Sean Sexton MRN: [id number] Date of Birth: 1961/08/01  Transition of Care [facility]) CM/SW Contact:    Beverly Sessions, RN Phone Number: 07/15/2022, 10:22 AM  Clinical Narrative:                   Transition of Care [facility]) Screening Note   Patient Details  Name: Sean Sexton Date of Birth: [birth year]   Transition of Care [facility]) CM/SW Contact:    Beverly Sessions, RN Phone Number: 07/15/2022, 10:22 AM    Transition of Care Department (TOC) has reviewed patient and no TOC needs have been identified at this time. We will continue to monitor patient advancement through interdisciplinary progression rounds. If new patient transition needs arise, please place a TOC consult.         Patient Goals and CMS Choice        Expected Discharge Plan and Services                                                Prior Living Arrangements/Services                       Activities of Daily Living Home Assistive Devices/Equipment: None ADL Screening (condition at time of admission) Patient's cognitive ability adequate to safely complete daily activities?: Yes Is the patient deaf or have difficulty hearing?: No Does the patient have difficulty seeing, even when wearing glasses/contacts?: No Does the patient have difficulty concentrating, remembering, or making decisions?: No Patient able to express need for assistance with ADLs?: No Does the patient have difficulty dressing or bathing?: No Independently performs ADLs?: Yes (appropriate for developmental age) Does the patient have difficulty walking or climbing stairs?: No Weakness of Legs: None Weakness of Arms/Hands: None  Permission Sought/Granted                  Emotional Assessment              Admission diagnosis:  Acute pancreatitis [K85.90] Elevated LFTs [R79.89] Acute kidney injury ([place])  [N17.9] Acute pancreatitis, unspecified complication status, unspecified pancreatitis type [K85.90] Nausea and vomiting, unspecified vomiting type [R11.2] Patient Active Problem List   Diagnosis Date Noted   Necrotizing pancreatitis 07/13/2022   Gall stone pancreatitis 07/13/2022   Benign essential hypertension 06/13/2014   History of hepatitis C 06/13/2014   PCP:  Derinda Late, MD Pharmacy:   [id number] [place] [place]Phone: [phone]: [phone]    Social Determinants of Health (SDOH) Interventions    Readmission Risk Interventions     No data to display

## 2022-07-16 ENCOUNTER — Encounter
Admission: EM | Disposition: A | Discharge status: Home / Self Care | Payer: Self-pay | Source: Home / Self Care | Attending: Internal Medicine

## 2022-07-16 ENCOUNTER — Inpatient Hospital Stay: Payer: 59 | Admitting: Certified Registered Nurse Anesthetist

## 2022-07-16 ENCOUNTER — Other Ambulatory Visit: Payer: Self-pay

## 2022-07-16 ENCOUNTER — Encounter: Payer: Self-pay | Admitting: Internal Medicine

## 2022-07-16 LAB — BASIC METABOLIC PANEL
Anion gap: 8 (ref 5–15)
BUN: 19 mg/dL (ref 8–23)
CO2: 24 mmol/L (ref 22–32)
Calcium: 7.9 mg/dL — ABNORMAL LOW (ref 8.9–10.3)
Chloride: 102 mmol/L (ref 98–111)
Creatinine, Ser: 0.95 mg/dL (ref 0.61–1.24)
GFR, Estimated: >60 mL/min (ref >60)
Glucose, Bld: 96 mg/dL (ref 70–99)
Potassium: 3.9 mmol/L (ref 3.5–5.1)
Sodium: 134 mmol/L — ABNORMAL LOW (ref 135–145)

## 2022-07-16 LAB — HCV RNA BY PCR, QN RFX GENO
HCV RNA Qnt(log copy/mL): UNDETERMINED log10 IU/mL
HepC Qn: NOT DETECTED IU/mL

## 2022-07-16 LAB — CBC
HCT: 42.1 % (ref 39.0–52.0)
Hemoglobin: 14.9 g/dL (ref 13.0–17.0)
MCH: 35.8 pg — ABNORMAL HIGH (ref 26.0–34.0)
MCHC: 35.4 g/dL (ref 30.0–36.0)
MCV: 101.2 fL — ABNORMAL HIGH (ref 80.0–100.0)
Platelets: 200 10*3/uL (ref 150–400)
RBC: 4.16 MIL/uL — ABNORMAL LOW (ref 4.22–5.81)
RDW: 12.6 % (ref 11.5–15.5)
WBC: 18.7 10*3/uL — ABNORMAL HIGH (ref 4.0–10.5)
nRBC: 0 % (ref 0.0–0.2)

## 2022-07-16 LAB — MAGNESIUM: Magnesium: 2 mg/dL (ref 1.7–2.4)

## 2022-07-16 SURGERY — CHOLECYSTECTOMY, ROBOT-ASSISTED, LAPAROSCOPIC
Anesthesia: General | Site: Abdomen

## 2022-07-16 MED ORDER — ONDANSETRON HCL 4 MG/2ML IJ SOLN
INTRAMUSCULAR | Status: AC
  Filled 2022-07-16: qty 2

## 2022-07-16 MED ORDER — DEXAMETHASONE SODIUM PHOSPHATE 10 MG/ML IJ SOLN
INTRAMUSCULAR | Status: AC
  Filled 2022-07-16: qty 1

## 2022-07-16 MED ORDER — BUPIVACAINE HCL (PF) 0.5 % IJ SOLN
INTRAMUSCULAR | Status: AC
  Filled 2022-07-16: qty 30

## 2022-07-16 MED ORDER — PROPOFOL 10 MG/ML IV BOLUS
INTRAVENOUS | Status: DC | PRN
  Administered 2022-07-16: 150 mg via INTRAVENOUS

## 2022-07-16 MED ORDER — MIDAZOLAM HCL 2 MG/2ML IJ SOLN
INTRAMUSCULAR | Status: DC | PRN
  Administered 2022-07-16: 1 mg via INTRAVENOUS

## 2022-07-16 MED ORDER — FENTANYL CITRATE (PF) 100 MCG/2ML IJ SOLN
INTRAMUSCULAR | Status: DC | PRN
  Administered 2022-07-16 (×2): 50 ug via INTRAVENOUS

## 2022-07-16 MED ORDER — SUGAMMADEX SODIUM 200 MG/2ML IV SOLN
INTRAVENOUS | Status: DC | PRN
  Administered 2022-07-16: 181.4 mg via INTRAVENOUS

## 2022-07-16 MED ORDER — INDOCYANINE GREEN 25 MG IV SOLR
1.2500 mg | INTRAVENOUS | Status: AC
  Administered 2022-07-16: 1.25 mg via INTRAVENOUS
  Filled 2022-07-16: qty 0.5

## 2022-07-16 MED ORDER — DEXAMETHASONE SODIUM PHOSPHATE 10 MG/ML IJ SOLN
INTRAMUSCULAR | Status: DC | PRN
  Administered 2022-07-16: 10 mg via INTRAVENOUS

## 2022-07-16 MED ORDER — KETOROLAC TROMETHAMINE 30 MG/ML IJ SOLN
INTRAMUSCULAR | Status: AC
  Filled 2022-07-16: qty 1

## 2022-07-16 MED ORDER — ACETAMINOPHEN 10 MG/ML IV SOLN
INTRAVENOUS | Status: AC
  Filled 2022-07-16: qty 100

## 2022-07-16 MED ORDER — PIPERACILLIN-TAZOBACTAM 3.375 G IVPB
INTRAVENOUS | Status: AC
  Filled 2022-07-16: qty 50

## 2022-07-16 MED ORDER — INDOCYANINE GREEN 25 MG IV SOLR
1.2500 mg | Freq: Once | INTRAVENOUS | Status: DC
  Filled 2022-07-16: qty 10

## 2022-07-16 MED ORDER — LIDOCAINE-EPINEPHRINE (PF) 1 %-1:200000 IJ SOLN
INTRAMUSCULAR | Status: DC | PRN
  Administered 2022-07-16: 10 mL via INTRAMUSCULAR
  Administered 2022-07-16: 20 mL via INTRAMUSCULAR

## 2022-07-16 MED ORDER — KETOROLAC TROMETHAMINE 30 MG/ML IJ SOLN
INTRAMUSCULAR | Status: DC | PRN
  Administered 2022-07-16: 15 mg via INTRAVENOUS

## 2022-07-16 MED ORDER — MIDAZOLAM HCL 2 MG/2ML IJ SOLN
INTRAMUSCULAR | Status: AC
  Filled 2022-07-16: qty 2

## 2022-07-16 MED ORDER — ACETAMINOPHEN 10 MG/ML IV SOLN
INTRAVENOUS | Status: DC | PRN
  Administered 2022-07-16: 1000 mg via INTRAVENOUS

## 2022-07-16 MED ORDER — SODIUM CHLORIDE 0.9 % IR SOLN
Status: DC | PRN
  Administered 2022-07-16: 1000 mL

## 2022-07-16 MED ORDER — LIDOCAINE HCL (CARDIAC) PF 100 MG/5ML IV SOSY
PREFILLED_SYRINGE | INTRAVENOUS | Status: DC | PRN
  Administered 2022-07-16: 80 mg via INTRAVENOUS

## 2022-07-16 MED ORDER — ROCURONIUM BROMIDE 100 MG/10ML IV SOLN
INTRAVENOUS | Status: DC | PRN
  Administered 2022-07-16: 60 mg via INTRAVENOUS
  Administered 2022-07-16 (×2): 10 mg via INTRAVENOUS

## 2022-07-16 MED ORDER — PHENYLEPHRINE HCL (PRESSORS) 10 MG/ML IV SOLN
INTRAVENOUS | Status: DC | PRN
  Administered 2022-07-16: 40 ug via INTRAVENOUS
  Administered 2022-07-16 (×2): 80 ug via INTRAVENOUS
  Administered 2022-07-16: 40 ug via INTRAVENOUS

## 2022-07-16 MED ORDER — 0.9 % SODIUM CHLORIDE (POUR BTL) OPTIME
TOPICAL | Status: DC | PRN
  Administered 2022-07-16: 500 mL

## 2022-07-16 MED ORDER — LIDOCAINE-EPINEPHRINE (PF) 1 %-1:200000 IJ SOLN
INTRAMUSCULAR | Status: AC
  Filled 2022-07-16: qty 30

## 2022-07-16 MED ORDER — OXYCODONE HCL 5 MG PO TABS
5.0000 mg | ORAL_TABLET | ORAL | Status: DC | PRN
  Administered 2022-07-17: 10 mg via ORAL
  Administered 2022-07-17: 5 mg via ORAL
  Administered 2022-07-17: 10 mg via ORAL
  Administered 2022-07-18 (×3): 5 mg via ORAL
  Administered 2022-07-18: 10 mg via ORAL
  Administered 2022-07-19: 5 mg via ORAL
  Filled 2022-07-16 (×3): qty 2
  Filled 2022-07-16 (×3): qty 1
  Filled 2022-07-16 (×3): qty 2

## 2022-07-16 MED ORDER — ONDANSETRON HCL 4 MG/2ML IJ SOLN
INTRAMUSCULAR | Status: DC | PRN
  Administered 2022-07-16: 4 mg via INTRAVENOUS

## 2022-07-16 MED ORDER — ENOXAPARIN SODIUM 40 MG/0.4ML IJ SOSY
40.0000 mg | PREFILLED_SYRINGE | INTRAMUSCULAR | Status: DC
  Administered 2022-07-17 – 2022-07-21 (×5): 40 mg via SUBCUTANEOUS
  Filled 2022-07-16 (×5): qty 0.4

## 2022-07-16 MED ORDER — FENTANYL CITRATE (PF) 100 MCG/2ML IJ SOLN
INTRAMUSCULAR | Status: AC
  Filled 2022-07-16: qty 2

## 2022-07-16 MED ORDER — PROPOFOL 10 MG/ML IV BOLUS
INTRAVENOUS | Status: AC
  Filled 2022-07-16: qty 40

## 2022-07-16 SURGICAL SUPPLY — 59 items
ANCHOR TIS RET SYS 235ML (MISCELLANEOUS) ×1 IMPLANT
BAG PRESSURE INF REUSE 1000 (BAG) IMPLANT
BLADE SURG SZ11 CARB STEEL (BLADE) ×1 IMPLANT
CANNULA REDUC XI 12-8 STAPL (CANNULA) ×1
CANNULA REDUCER 12-8 DVNC XI (CANNULA) ×1 IMPLANT
CATH REDDICK CHOLANGI 4FR 50CM (CATHETERS) IMPLANT
CLIP LIGATING HEMO O LOK GREEN (MISCELLANEOUS) ×1 IMPLANT
DERMABOND ADVANCED .7 DNX12 (GAUZE/BANDAGES/DRESSINGS) ×1 IMPLANT
DRAPE ARM DVNC X/XI (DISPOSABLE) ×4 IMPLANT
DRAPE C-ARM XRAY 36X54 (DRAPES) IMPLANT
DRAPE COLUMN DVNC XI (DISPOSABLE) ×1 IMPLANT
DRAPE DA VINCI XI ARM (DISPOSABLE) ×4
DRAPE DA VINCI XI COLUMN (DISPOSABLE) ×1
ELECT CAUTERY BLADE 6.4 (BLADE) ×1 IMPLANT
ELECT REM PT RETURN 9FT ADLT (ELECTROSURGICAL) ×1
ELECTRODE REM PT RTRN 9FT ADLT (ELECTROSURGICAL) ×1 IMPLANT
GLOVE BIOGEL PI IND STRL 7.0 (GLOVE) ×2 IMPLANT
GLOVE SURG SYN 6.5 ES PF (GLOVE) ×2 IMPLANT
GLOVE SURG SYN 6.5 PF PI (GLOVE) ×2 IMPLANT
GOWN STRL REUS W/ TWL LRG LVL3 (GOWN DISPOSABLE) ×3 IMPLANT
GOWN STRL REUS W/TWL LRG LVL3 (GOWN DISPOSABLE) ×3
GRASPER SUT TROCAR 14GX15 (MISCELLANEOUS) IMPLANT
IRRIGATOR SUCT 8 DISP DVNC XI (IRRIGATION / IRRIGATOR) IMPLANT
IRRIGATOR SUCTION 8MM XI DISP (IRRIGATION / IRRIGATOR)
IV NS 1000ML (IV SOLUTION)
IV NS 1000ML BAXH (IV SOLUTION) IMPLANT
KIT TURNOVER KIT A (KITS) ×1 IMPLANT
LABEL OR SOLS (LABEL) ×1 IMPLANT
MANIFOLD NEPTUNE II (INSTRUMENTS) ×1 IMPLANT
NDL INSUFFLATION 14GA 120MM (NEEDLE) ×1 IMPLANT
NEEDLE HYPO 22GX1.5 SAFETY (NEEDLE) ×1 IMPLANT
NEEDLE INSUFFLATION 14GA 120MM (NEEDLE) ×1 IMPLANT
NS IRRIG 500ML POUR BTL (IV SOLUTION) ×1 IMPLANT
OBTURATOR OPTICAL STANDARD 8MM (TROCAR) ×1
OBTURATOR OPTICAL STND 8 DVNC (TROCAR) ×1
OBTURATOR OPTICALSTD 8 DVNC (TROCAR) ×1 IMPLANT
PACK LAP CHOLECYSTECTOMY (MISCELLANEOUS) ×1 IMPLANT
PENCIL SMOKE EVACUATOR (MISCELLANEOUS) ×1 IMPLANT
RELOAD STAPLE 45 3.5 BLU DVNC (STAPLE) IMPLANT
RELOAD STAPLER 3.5X45 BLU DVNC (STAPLE) ×1 IMPLANT
SEAL CANN UNIV 5-8 DVNC XI (MISCELLANEOUS) ×3 IMPLANT
SEAL XI 5MM-8MM UNIVERSAL (MISCELLANEOUS) ×3
SET TUBE SMOKE EVAC HIGH FLOW (TUBING) ×1 IMPLANT
SOLUTION ELECTROLUBE (MISCELLANEOUS) ×1 IMPLANT
SPIKE FLUID TRANSFER (MISCELLANEOUS) ×2 IMPLANT
STAPLER 45 DA VINCI SURE FORM (STAPLE) ×1
STAPLER 45 SUREFORM DVNC (STAPLE) IMPLANT
STAPLER CANNULA SEAL DVNC XI (STAPLE) ×1 IMPLANT
STAPLER CANNULA SEAL XI (STAPLE) ×1
STAPLER RELOAD 3.5X45 BLU DVNC (STAPLE) ×1
STAPLER RELOAD 3.5X45 BLUE (STAPLE) ×1
SUT MNCRL 4-0 (SUTURE) ×2
SUT MNCRL 4-0 27XMFL (SUTURE) ×2
SUT VICRYL 0 UR6 27IN ABS (SUTURE) ×1 IMPLANT
SUTURE MNCRL 4-0 27XMF (SUTURE) ×2 IMPLANT
SYR 30ML LL (SYRINGE) IMPLANT
SYSTEM WECK SHIELD CLOSURE (TROCAR) IMPLANT
TRAP FLUID SMOKE EVACUATOR (MISCELLANEOUS) ×1 IMPLANT
WATER STERILE IRR 500ML POUR (IV SOLUTION) ×1 IMPLANT

## 2022-07-16 NOTE — Progress Notes (Signed)
Report given to same day surgery RN. Pt just left the floor at 1139

## 2022-07-16 NOTE — Anesthesia Procedure Notes (Signed)
Procedure Name: Intubation Date/Time: 07/16/2022 12:25 PM  Performed by: Demetrius Charity, CRNAPre-anesthesia Checklist: Patient identified, Patient being monitored, Timeout performed, Emergency Drugs available and Suction available Patient Re-evaluated:Patient Re-evaluated prior to induction Oxygen Delivery Method: Circle system utilized Preoxygenation: Pre-oxygenation with 100% oxygen Induction Type: IV induction Ventilation: Mask ventilation without difficulty Laryngoscope Size: McGraph and 4 Grade View: Grade I Tube type: Oral Tube size: 7.0 mm Number of attempts: 1 Airway Equipment and Method: Stylet and Video-laryngoscopy Placement Confirmation: ETT inserted through vocal cords under direct vision, positive ETCO2 and breath sounds checked- equal and bilateral Secured at: 22 cm Tube secured with: Tape Dental Injury: Teeth and Oropharynx as per pre-operative assessment

## 2022-07-16 NOTE — Anesthesia Preprocedure Evaluation (Addendum)
Anesthesia Evaluation  Patient identified by MRN, date of birth, ID band Patient awake  General Assessment Comment:Patient admitted for sepsis 2/2 acute gallstone pancreatitis with necrosis   Reviewed: Allergy & Precautions, NPO status , Patient's Chart, lab work & pertinent test results  Airway Mallampati: II  TM Distance: >3 FB Neck ROM: full    Dental  (+) Teeth Intact   Pulmonary former smoker   Pulmonary exam normal        Cardiovascular hypertension, On Medications Normal cardiovascular exam     Neuro/Psych negative neurological ROS  negative psych ROS   GI/Hepatic negative GI ROS,,,(+) Hepatitis -, C  Endo/Other  negative endocrine ROS    Renal/GU      Musculoskeletal   Abdominal   Peds  Hematology negative hematology ROS (+)   Anesthesia Other Findings Past Medical History: No date: Hepatitis     Comment:  [email or web address] No date: Hypertension  Past Surgical History: 07/07/2015: COLONOSCOPY; N/A     Comment:  Procedure: COLONOSCOPY;  Surgeon: Josefine Class,               MD;  Location: [facility] ENDOSCOPY;  Service: Endoscopy;                Laterality: N/A; No date: TONSILLECTOMY  BMI    Body Mass Index: 27.12 kg/m      Reproductive/Obstetrics negative OB ROS                             Anesthesia Physical Anesthesia Plan  ASA: 3  Anesthesia Plan: General ETT   Post-op Pain Management: Toradol IV (intra-op)* and Dilaudid IV   Induction: Intravenous  PONV Risk Score and Plan: Ondansetron, Dexamethasone, Midazolam and Treatment may vary due to age or medical condition  Airway Management Planned: Oral ETT  Additional Equipment:   Intra-op Plan:   Post-operative Plan: Extubation in OR  Informed Consent: I have reviewed the patients History and Physical, chart, labs and discussed the procedure including the risks, benefits and alternatives for the proposed anesthesia  with the patient or authorized representative who has indicated his/her understanding and acceptance.     Dental Advisory Given  Plan Discussed with: Anesthesiologist, CRNA and Surgeon  Anesthesia Plan Comments: (Patient consented for risks of anesthesia including but not limited to:  - adverse reactions to medications - damage to eyes, teeth, lips or other oral mucosa - nerve damage due to positioning  - sore throat or hoarseness - Damage to heart, brain, nerves, lungs, other parts of body or loss of life  Patient voiced understanding.)        Anesthesia Quick Evaluation

## 2022-07-16 NOTE — Progress Notes (Addendum)
  PROGRESS NOTE    Sean Sexton  OTL:[id number] DOB: 03-Jan-1961 DOA: 07/13/2022 PCP: Derinda Late, MD  [id number]/[id number]-AA  LOS: 3 days   Brief hospital course:   Assessment & Plan: Sean Sexton is a 61 y.o. male with medical history significant for Hepatitis C s/p Harvoni x 12 weeks, HTN, history of adenomatous colon polyp, who presented to the ED with a 1 to 2-day history of abdominal pain in the mid upper abdomen associated with nonbloody nonbilious vomiting.   * Acute gallstone pancreatitis with necrosis  lipase of 1042 on presentation, trending down now. Patient denies alcohol use Right upper quadrant ultrasound showed Cholelithiasis. But no evidence of acute cholecystitis or bile duct obstruction. --GI consulted on admission.  GenSurg consulted the next day for possible cholecystectomy.   --due to elevated WBC, CT a/p obtained which showed necrosis at the level of the pancreatic neck and body.  Pt started on zosyn. Plan: --cont MIVF'@125'$  --cont zosyn --Robotic assisted Laparoscopic Cholecystectomy today  Sepsis 2/2 pancreatic necrosis --fever to 100.9 morning of 12/11, with leukocytosis --blood cx obtained (after already receiving zosyn for 1 day)  Elevated LFT's --likely due to pancreatitis and sepsis --monitor  History of hepatitis C Completed prior 12-week treatment with Harvoni   Benign essential hypertension --hold home lisinopril-hydrochlorothiazide   AKI --Cr 1.37 on presentation, improved to 0.95 with IVF.   DVT prophylaxis: SCD/Compression stockings Code Status: Full code  Family Communication:  Level of care: Med-Surg Dispo:   The patient is from: home Anticipated d/c is to: home Anticipated d/c date is: 1-2 days if recovering well from cholecystectomy and WBC and pain improve.   Subjective and Interval History:  Pt went for Cholecystectomy today, tolerated it well.    Objective: Vitals:   07/16/22 1500 07/16/22 1515 07/16/22 1532 07/16/22  1616  BP: 122/85 125/88 (!) 139/90 (!) 145/90  Pulse: 88 85 84 84  Resp: '13 16 18 14  '$ Temp:  98.3 F (36.8 C) 99 F (37.2 C) 98.5 F (36.9 C)  TempSrc:    Oral  SpO2: 95% 96% 96% 96%  Weight:      Height:        Intake/Output Summary (Last 24 hours) at 07/16/2022 1747 Last data filed at 07/16/2022 1519 Gross per 24 hour  Intake 1055.2 ml  Output 510 ml  Net 545.2 ml   Filed Weights   07/12/22 2138 07/12/22 2148  Weight: 90.7 kg 90.7 kg    Examination:   Constitutional: NAD, AAOx3 HEENT: conjunctivae and lids normal, EOMI CV: No cyanosis.   RESP: normal respiratory effort, on RA GI: 5 small incision sites over abdomen, clean and dry SKIN: warm, dry Neuro: II - XII grossly intact.   Psych: Normal mood and affect.  Appropriate judgement and reason   Data Reviewed: I have personally reviewed labs and imaging studies  Time spent: 35 minutes  Enzo Bi, MD [facility] If 7PM-7AM, please contact night-coverage 07/16/2022, 5:47 PM

## 2022-07-16 NOTE — Transfer of Care (Signed)
Immediate Anesthesia Transfer of Care Note  Patient: HALLIE ISHIDA  Procedure(s) Performed: XI ROBOTIC ASSISTED LAPAROSCOPIC CHOLECYSTECTOMY (Abdomen) INDOCYANINE GREEN FLUORESCENCE IMAGING (ICG) (Abdomen)  Patient Location: PACU  Anesthesia Type:General  Level of Consciousness: awake, alert , and oriented  Airway & Oxygen Therapy: Patient Spontanous Breathing and Patient connected to face mask oxygen  Post-op Assessment: Report given to RN and Post -op Vital signs reviewed and stable  Post vital signs: Reviewed and stable  Last Vitals:  Vitals Value Taken Time  BP 125/82 07/16/22 1420  Temp    Pulse 94 07/16/22 1423  Resp 13 07/16/22 1423  SpO2 99 % 07/16/22 1423  Vitals shown include unvalidated device data.  Last Pain:  Vitals:   07/16/22 1151  TempSrc: Temporal  PainSc: 8       Patients Stated Pain Goal: 0 (13/24/40 1027)  Complications: No notable events documented.

## 2022-07-16 NOTE — Op Note (Signed)
Preoperative diagnosis:  cholelithiasis and pancreatitis  Postoperative diagnosis: same as above plus acute cholecystitis  Procedure: Robotic assisted Laparoscopic Cholecystectomy.   Anesthesia: GETA   Surgeon: Benjamine Sprague  Specimen: Gallbladder  Complications: None  EBL: 104m  Wound Classification: Clean Contaminated  Indications: see HPI  Findings: Critical view of safety noted Cystic duct and artery identified, ligated and divided Adequate hemostasis  Description of procedure:  The patient was placed on the operating table in the supine position. SCDs placed, pre-op abx administered.  General anesthesia was induced and OG tube placed by anesthesia. A time-out was completed verifying correct patient, procedure, site, positioning, and implant(s) and/or special equipment prior to beginning this procedure. The abdomen was prepped and draped in the usual sterile fashion.    Veress needle was placed at the Palmer's point and insufflation was started after confirming a positive saline drop test and no immediate increase in abdominal pressure.  After reaching 15 mm, the Veress needle was removed and a 8 mm port was placed via optiview technique under umbilicus measured 273XTfrom gallbladder.  The abdomen was inspected and no abnormalities or injuries were found.  Under direct vision, ports were placed in the following locations: One 12 mm patient left of the umbilicus, 8cm from the optiviewed port, one 8 mm port placed to the patient right of the umbilical port 8 cm apart.  1 additional 8 mm port placed lateral to the 148mport.  Once ports were placed, The table was placed in the reverse Trendelenburg position with the right side up. The Xi platform was brought into the operative field and docked to the ports successfully.  An endoscope was placed through the umbilical port, fenestrated grasper through the adjacent patient right port, prograsp to the far patient left port, and then a hook  cautery in the left port.  Extensive omental adhesions noted to cover the entire liver.  This was gently teased away until the gallbladder dome was visualized.  The dome of the gallbladder was grasped with prograsp, passed and retracted over the dome of the liver.  Additional adhesions between the gallbladder and omentum, duodenum and transverse colon were lysed via hook cautery. The infundibulum was grasped with the fenestrated grasper and retracted toward the right lower quadrant. This maneuver exposed Calot's triangle. The peritoneum overlying the gallbladder infundibulum was then dissected  and the cystic duct and cystic artery eventually identified.  Critical view of safety with the liver bed clearly visible behind the duct and artery with no additional structures noted.  Close triangle area is very thickened due to the adjacent pancreatitis as well as acute cholecystitis, once the common bile duct was clearly visualized via ICG adjacent to a enlarged cystic duct,  cystic artery clipped and divided close to the gallbladder.  45 mm blue load stapler was used to transect the cystic duct closer to the gallbladder due to the extreme degree of inflammation making further dissection towards a smaller caliber cystic duct adjacent to the common bile duct worrisome for common bile duct injury.   The gallbladder was then dissected from its peritoneal and liver bed attachments by electrocautery. Hemostasis was checked prior to removing the hook cautery and the Endo Catch bag was then placed through the 12 mm port and the gallbladder was removed.  The gallbladder was passed off the table as a specimen. There was no evidence of bleeding from the gallbladder fossa or cystic artery or leakage of the bile from the cystic duct stump. The  12 mm port site closed with Efx shield using 0 vicryl under direct vision.  Abdomen desufflated and secondary trocars were removed under direct vision. No bleeding was noted. All skin  incisions then closed with subcuticular sutures of 4-0 monocryl and dressed with topical skin adhesive. The orogastric tube was removed and patient extubated.  The patient tolerated the procedure well and was taken to the postanesthesia care unit in stable condition.  All sponge and instrument count correct at end of procedure.

## 2022-07-17 DIAGNOSIS — K8591 Acute pancreatitis with uninfected necrosis, unspecified: Secondary | ICD-10-CM | POA: Diagnosis not present

## 2022-07-17 LAB — CBC
HCT: 42.8 % (ref 39.0–52.0)
Hemoglobin: 15.1 g/dL (ref 13.0–17.0)
MCH: 35.9 pg — ABNORMAL HIGH (ref 26.0–34.0)
MCHC: 35.3 g/dL (ref 30.0–36.0)
MCV: 101.7 fL — ABNORMAL HIGH (ref 80.0–100.0)
Platelets: 240 10*3/uL (ref 150–400)
RBC: 4.21 MIL/uL — ABNORMAL LOW (ref 4.22–5.81)
RDW: 12.6 % (ref 11.5–15.5)
WBC: 21.2 10*3/uL — ABNORMAL HIGH (ref 4.0–10.5)
nRBC: 0 % (ref 0.0–0.2)

## 2022-07-17 LAB — COMPREHENSIVE METABOLIC PANEL
ALT: 54 U/L — ABNORMAL HIGH (ref 0–44)
AST: 42 U/L — ABNORMAL HIGH (ref 15–41)
Albumin: 2.7 g/dL — ABNORMAL LOW (ref 3.5–5.0)
Alkaline Phosphatase: 105 U/L (ref 38–126)
Anion gap: 8 (ref 5–15)
BUN: 24 mg/dL — ABNORMAL HIGH (ref 8–23)
CO2: 26 mmol/L (ref 22–32)
Calcium: 8.2 mg/dL — ABNORMAL LOW (ref 8.9–10.3)
Chloride: 103 mmol/L (ref 98–111)
Creatinine, Ser: 1.05 mg/dL (ref 0.61–1.24)
GFR, Estimated: >60 mL/min (ref >60)
Glucose, Bld: 121 mg/dL — ABNORMAL HIGH (ref 70–99)
Potassium: 4 mmol/L (ref 3.5–5.1)
Sodium: 137 mmol/L (ref 135–145)
Total Bilirubin: 1 mg/dL (ref 0.3–1.2)
Total Protein: 6.8 g/dL (ref 6.5–8.1)

## 2022-07-17 LAB — GLUCOSE, CAPILLARY: Glucose-Capillary: 100 mg/dL — ABNORMAL HIGH (ref 70–99)

## 2022-07-17 LAB — MAGNESIUM: Magnesium: 2.3 mg/dL (ref 1.7–2.4)

## 2022-07-17 NOTE — Progress Notes (Signed)
Subjective:  CC: Sean Sexton is a 61 y.o. male  Hospital stay day 4, 1 Day Post-Op robotic lap chole for gallstone necrotizing pancreatitis  HPI: No acute issues overnight.  Tolerated clear liquid diet  ROS:  General: Denies weight loss, weight gain, fatigue, fevers, chills, and night sweats. Heart: Denies chest pain, palpitations, racing heart, irregular heartbeat, leg pain or swelling, and decreased activity tolerance. Respiratory: Denies breathing difficulty, shortness of breath, wheezing, cough, and sputum. GI: Denies change in appetite, heartburn, nausea, vomiting, constipation, diarrhea, and blood in stool. GU: Denies difficulty urinating, pain with urinating, urgency, frequency, blood in urine.   Objective:   Temp:  [97.9 F (36.6 C)-99 F (37.2 C)] 99 F (37.2 C) (12/13 1552) Pulse Rate:  [65-87] 69 (12/13 1552) Resp:  [16-19] 19 (12/13 1552) BP: (115-139)/(75-96) 139/89 (12/13 1552) SpO2:  [95 %-100 %] 100 % (12/13 1552)     Height: 6' (182.9 cm) Weight: 90.7 kg BMI (Calculated): 27.12   Intake/Output this shift:   Intake/Output Summary (Last 24 hours) at 07/17/2022 1730 Last data filed at 07/17/2022 1417 Gross per 24 hour  Intake 480 ml  Output 400 ml  Net 80 ml    Constitutional :  alert, cooperative, appears stated age, and no distress  Respiratory:  clear to auscultation bilaterally  Cardiovascular:  regular rate and rhythm  Gastrointestinal: Soft, no guarding, tenderness to palpation around incisions as expected .   Skin: Cool and moist.  Incisions clean dry and intact  Psychiatric: Normal affect, non-agitated, not confused       LABS:     Latest Ref Rng & Units 07/17/2022    1:27 AM 07/16/2022    4:42 AM 07/15/2022    4:07 AM  CMP  Glucose 70 - 99 mg/dL 121  96  100   BUN 8 - 23 mg/dL '24  19  21   '$ Creatinine 0.61 - 1.24 mg/dL 1.05  0.95  0.95   Sodium 135 - 145 mmol/L 137  134  135   Potassium 3.5 - 5.1 mmol/L 4.0  3.9  3.9   Chloride 98 -  111 mmol/L 103  102  104   CO2 22 - 32 mmol/L '26  24  23   '$ Calcium 8.9 - 10.3 mg/dL 8.2  7.9  7.8   Total Protein 6.5 - 8.1 g/dL 6.8   6.1   Total Bilirubin 0.3 - 1.2 mg/dL 1.0   1.4   Alkaline Phos 38 - 126 U/L 105   89   AST 15 - 41 U/L 42   48   ALT 0 - 44 U/L 54   58       Latest Ref Rng & Units 07/17/2022    1:27 AM 07/16/2022    4:42 AM 07/15/2022    4:07 AM  CBC  WBC 4.0 - 10.5 K/uL 21.2  18.7  22.3   Hemoglobin 13.0 - 17.0 g/dL 15.1  14.9  14.8   Hematocrit 39.0 - 52.0 % 42.8  42.1  41.9   Platelets 150 - 400 K/uL 240  200  196     RADS: CLINICAL DATA:  Abdominal pain.   EXAM: CT ABDOMEN AND PELVIS WITH CONTRAST   TECHNIQUE: Multidetector CT imaging of the abdomen and pelvis was performed using the standard protocol following bolus administration of intravenous contrast.   RADIATION DOSE REDUCTION: This exam was performed according to the departmental dose-optimization program which includes automated exposure control, adjustment of the mA and/or kV  according to patient size and/or use of iterative reconstruction technique.   CONTRAST:  53m OMNIPAQUE IOHEXOL 350 MG/ML SOLN   COMPARISON:  Previous abdomen ultrasounds, most recent 07/13/2022.   FINDINGS: Lower chest: Trace effusions and dependent lower lobe atelectasis. No acute findings.   Hepatobiliary: 3 subcentimeter low-attenuation liver lesions. 6 mm lesion at the dome of the right lobe, smaller low-attenuation lesion from the lateral margin of the left lobe and tiny lesion in the central right lobe, all consistent with cysts. Liver demonstrates central volume loss. This raises possibility of cirrhosis as was suggested on the recent ultrasound. No other liver abnormality. Multiple gallstones. No gallbladder wall thickening or evidence of acute cholecystitis. No bile duct dilation.   Pancreas: Peripancreatic inflammatory changes and fluid. There is lack of enhancement at the level of the pancreatic  neck and body consistent with necrosis. Remainder of the pancreas demonstrates normal enhancement. No mass. No duct dilation. No defined peripancreatic fluid collection.   Spleen: Normal in size without focal abnormality.   Adrenals/Urinary Tract: Normal adrenal glands. Kidneys normal size, orientation and position with symmetric enhancement and excretion. Small nonobstructing stone, upper pole the left kidney. No renal masses. No hydronephrosis. Normal ureters. Normal bladder.   Stomach/Bowel: Decompressed stomach, otherwise unremarkable. Small bowel and colon are normal in caliber. No wall thickening. No inflammation. Normal appendix visualized.   Vascular/Lymphatic: Minimal aortic atherosclerosis. No aneurysm. Several prominent gastrohepatic ligament lymph nodes, all subcentimeter in short axis.   Reproductive: Prominent prostate, 4.1 x 3.8 cm transversely.   Other: Small amount of ascites collects adjacent to the liver and spleen, lies adjacent to the pancreas and extends along the small bowel mesentery, as well as collecting in the pelvis.   Musculoskeletal: No fracture or acute finding.  No bone lesion.   IMPRESSION: 1. Acute pancreatitis complicated by necrosis at the level of the pancreatic neck and body. No defined peripancreatic fluid collection. There is associated peripancreatic inflammation as well as a small amount of ascites. 2. Multiple gallstones. No evidence of acute cholecystitis. No visualized duct stone and no duct dilation.     Electronically Signed   By: DLajean ManesM.D.   On: 07/14/2022 13:06 Assessment:   robotic lap chole for gallstone necrotizing pancreatitis.   Recovering well.  Advance to full liquid diet.  Continue IV antibiotics for degree of infection of  gallbladder.  Make sure leukocytosis decreases again tomorrow  labs/images/medications/previous chart entries reviewed personally and relevant changes/updates noted above.

## 2022-07-17 NOTE — Progress Notes (Signed)
[facility] [place] NAME: Sean Sexton    MR#:  [id number]  DATE OF BIRTH:  11-Sep-1960  SUBJECTIVE:   Daughter at bedside. Patient is postop day one. Tolerating clear liquid diet. Not much pain. No nausea vomiting.   VITALS:  Blood pressure 139/89, pulse 69, temperature 99 F (37.2 C), temperature source Oral, resp. rate 19, height 6' (1.829 m), weight 90.7 kg, SpO2 100 %.  PHYSICAL EXAMINATION:   GENERAL:  61 y.o.-year-old patient lying in the bed with no acute distress.  LUNGS: Normal breath sounds bilaterally, no wheezing CARDIOVASCULAR: S1, S2 normal. No murmur   ABDOMEN: Soft, nontender, nondistended. Bowel sounds present. Surgical incision + EXTREMITIES: No  edema b/l.    NEUROLOGIC: nonfocal  patient is alert and awake SKIN: No obvious rash, lesion, or ulcer.   LABORATORY PANEL:  CBC Recent Labs  Lab 07/17/22 0127  WBC 21.2*  HGB 15.1  HCT 42.8  PLT 240    Chemistries  Recent Labs  Lab 07/17/22 0127  NA 137  K 4.0  CL 103  CO2 26  GLUCOSE 121*  BUN 24*  CREATININE 1.05  CALCIUM 8.2*  MG 2.3  AST 42*  ALT 54*  ALKPHOS 105  BILITOT 1.0    Assessment and Plan  Sean Sexton is a 61 y.o. male with medical history significant for Hepatitis C s/p Harvoni x 12 weeks, HTN, history of adenomatous colon polyp, who presented to the ED with a 1 to 2-day history of abdominal pain in the mid upper abdomen associated with nonbloody nonbilious vomiting.   Acute gallstone pancreatitis with necrosis  lipase of 1042 on presentation, trending down now. Patient denies alcohol use Right upper quadrant ultrasound showed Cholelithiasis. But no evidence of acute cholecystitis or bile duct obstruction. --GI consulted and GenSurg consulted  --due to elevated WBC, CT a/p obtained which showed necrosis at the level of the pancreatic neck and body.  Pt started on zosyn. --cont MIVF'@100'$  --cont zosyn --s/p POD #1 Robotic  assisted Laparoscopic Cholecystectomy -- already in clear liquid will advance to full liquid.   Sepsis 2/2 pancreatic necrosis --fever to 100.9 morning of 12/11, with leukocytosis --blood cx negative (after already receiving zosyn for 1 day) -- sepsis improving -- start mild bump in leukocytosis expected. No fever.   Elevated LFT's --likely due to pancreatitis and sepsis  History of hepatitis C Completed prior 12-week treatment with Harvoni   Benign essential hypertension --hold home lisinopril-hydrochlorothiazide    AKI --Cr 1.37 on presentation, improved to 0.95 with IVF.     DVT prophylaxis: SCD/Compression stockings Code Status: Full code  Family Communication: daughter at bedside Level of care: Med-Surg Dispo:   The patient is from: home Anticipated d/c is to: home  Will need to monitor labs specially white count. Anticipate discharge 1 to 2 days Level of care: Med-Surg Status is: Inpatient Remains inpatient appropriate because:Marland Kitchen Day one laparoscopic cholecystectomy. Continue IV antibiotic in advance diet as tolerated. Will check white count tomorrow. Anticipate discharge 1 to 2 days    TOTAL TIME TAKING CARE OF THIS PATIENT: 35 minutes.  >50% time spent on counselling and coordination of care  Note: This dictation was prepared with Dragon dictation along with smaller phrase technology. Any transcriptional errors that result from this process are unintentional.  Fritzi Mandes M.D    [facility]   CC: Primary care physician; Derinda Late, MD

## 2022-07-17 NOTE — Anesthesia Postprocedure Evaluation (Signed)
Anesthesia Post Note  Patient: Sean Sexton  Procedure(s) Performed: XI ROBOTIC ASSISTED LAPAROSCOPIC CHOLECYSTECTOMY (Abdomen) INDOCYANINE GREEN FLUORESCENCE IMAGING (ICG) (Abdomen)  Patient location during evaluation: PACU Anesthesia Type: General Level of consciousness: awake and alert Pain management: pain level controlled Vital Signs Assessment: post-procedure vital signs reviewed and stable Respiratory status: spontaneous breathing, nonlabored ventilation, respiratory function stable and patient connected to nasal cannula oxygen Cardiovascular status: blood pressure returned to baseline and stable Postop Assessment: no apparent nausea or vomiting Anesthetic complications: no   No notable events documented.   Last Vitals:  Vitals:   07/16/22 1928 07/17/22 0504  BP: (!) 128/96 115/75  Pulse: 87 65  Resp: 16 18  Temp: 36.9 C 36.6 C  SpO2: 96% 95%    Last Pain:  Vitals:   07/16/22 1928  TempSrc: Oral  PainSc: 0-No pain                 Ilene Qua

## 2022-07-18 DIAGNOSIS — K8591 Acute pancreatitis with uninfected necrosis, unspecified: Secondary | ICD-10-CM | POA: Diagnosis not present

## 2022-07-18 LAB — CBC
HCT: 37.3 % — ABNORMAL LOW (ref 39.0–52.0)
Hemoglobin: 13.2 g/dL (ref 13.0–17.0)
MCH: 35.7 pg — ABNORMAL HIGH (ref 26.0–34.0)
MCHC: 35.4 g/dL (ref 30.0–36.0)
MCV: 100.8 fL — ABNORMAL HIGH (ref 80.0–100.0)
Platelets: 254 10*3/uL (ref 150–400)
RBC: 3.7 MIL/uL — ABNORMAL LOW (ref 4.22–5.81)
RDW: 12.8 % (ref 11.5–15.5)
WBC: 18 10*3/uL — ABNORMAL HIGH (ref 4.0–10.5)
nRBC: 0 % (ref 0.0–0.2)

## 2022-07-18 LAB — VITAMIN B12: Vitamin B-12: 678 pg/mL (ref 180–914)

## 2022-07-18 LAB — SURGICAL PATHOLOGY

## 2022-07-18 MED ORDER — ACETAMINOPHEN 325 MG PO TABS: 650.0000 mg | ORAL_TABLET | Freq: Three times a day (TID) | ORAL | 0 refills | Status: AC | PRN

## 2022-07-18 MED ORDER — DOCUSATE SODIUM 100 MG PO CAPS: 100.0000 mg | ORAL_CAPSULE | Freq: Two times a day (BID) | ORAL | 0 refills | Status: AC | PRN

## 2022-07-18 MED ORDER — IBUPROFEN 800 MG PO TABS: 800.0000 mg | ORAL_TABLET | Freq: Three times a day (TID) | ORAL | 0 refills | Status: DC | PRN

## 2022-07-18 MED ORDER — PANTOPRAZOLE SODIUM 20 MG PO TBEC
20.0000 mg | DELAYED_RELEASE_TABLET | Freq: Every day | ORAL | Status: DC
  Administered 2022-07-18 – 2022-07-21 (×4): 20 mg via ORAL
  Filled 2022-07-18 (×4): qty 1

## 2022-07-18 MED ORDER — ADULT MULTIVITAMIN W/MINERALS CH
1.0000 | ORAL_TABLET | Freq: Every day | ORAL | Status: DC
  Administered 2022-07-18 – 2022-07-21 (×4): 1 via ORAL
  Filled 2022-07-18 (×4): qty 1

## 2022-07-18 MED ORDER — HYDROCODONE-ACETAMINOPHEN 5-325 MG PO TABS: 1.0000 | ORAL_TABLET | Freq: Four times a day (QID) | ORAL | 0 refills | Status: AC | PRN

## 2022-07-18 MED ORDER — DOCUSATE SODIUM 100 MG PO CAPS
100.0000 mg | ORAL_CAPSULE | Freq: Every day | ORAL | Status: DC | PRN
  Administered 2022-07-18: 100 mg via ORAL
  Filled 2022-07-18: qty 1

## 2022-07-18 MED ORDER — ASPIRIN 81 MG PO TBEC
81.0000 mg | DELAYED_RELEASE_TABLET | Freq: Every day | ORAL | Status: DC
  Administered 2022-07-18 – 2022-07-21 (×4): 81 mg via ORAL
  Filled 2022-07-18 (×4): qty 1

## 2022-07-18 NOTE — Progress Notes (Signed)
[facility] [place] NAME: Sean Sexton    MR#:  [id number]  DATE OF BIRTH:  28-Sep-1960  SUBJECTIVE:   Wife at bedside. Patient is postop day 2. surgery advanced active regular. Not much pain. No nausea vomiting. No fever  VITALS:  Blood pressure (!) 136/90, pulse 80, temperature 98.4 F (36.9 C), temperature source Oral, resp. rate 20, height 6' (1.829 m), weight 90.7 kg, SpO2 97 %.  PHYSICAL EXAMINATION:   GENERAL:  61 y.o.-year-old patient lying in the bed with no acute distress.  LUNGS: Normal breath sounds bilaterally, no wheezing CARDIOVASCULAR: S1, S2 normal. No murmur   ABDOMEN: Soft, nontender, nondistended. Bowel sounds present. Surgical incision + EXTREMITIES: No  edema b/l.    NEUROLOGIC: nonfocal  patient is alert and awake SKIN: No obvious rash, lesion, or ulcer.   LABORATORY PANEL:  CBC Recent Labs  Lab 07/18/22 0513  WBC 18.0*  HGB 13.2  HCT 37.3*  PLT 254     Chemistries  Recent Labs  Lab 07/17/22 0127  NA 137  K 4.0  CL 103  CO2 26  GLUCOSE 121*  BUN 24*  CREATININE 1.05  CALCIUM 8.2*  MG 2.3  AST 42*  ALT 54*  ALKPHOS 105  BILITOT 1.0     Assessment and Plan  Sean Sexton is a 61 y.o. male with medical history significant for Hepatitis C s/p Harvoni x 12 weeks, HTN, history of adenomatous colon polyp, who presented to the ED with a 1 to 2-day history of abdominal pain in the mid upper abdomen associated with nonbloody nonbilious vomiting.   Acute gallstone pancreatitis with necrosis  lipase of 1042 on presentation, trending down now. Patient denies alcohol use Right upper quadrant ultrasound showed Cholelithiasis. But no evidence of acute cholecystitis or bile duct obstruction. --GI consulted and GenSurg consulted  --due to elevated WBC, CT a/p obtained which showed necrosis at the level of the pancreatic neck and body.  --cont zosyn --s/p POD #2 Robotic assisted Laparoscopic  Cholecystectomy -- surgery advanced Diet to regular. White count down to 18,000   Sepsis 2/2 pancreatic necrosis --fever to 100.9 morning of 12/11, with leukocytosis --blood cx negative  -- sepsis improving   Elevated LFT's --likely due to pancreatitis and sepsis  History of hepatitis C Completed prior 12-week treatment with Harvoni   Benign essential hypertension --hold home lisinopril-hydrochlorothiazide    AKI --Cr 1.37 on presentation, improved to 0.95 with IVF.     DVT prophylaxis: SCD/Compression stockings Code Status: Full code  Family Communication: daughter at bedside Level of care: Med-Surg Dispo:   The patient is from: home Anticipated d/c is to: home  Level of care: Med-Surg Status is: Inpatient Remains inpatient appropriate because:.POD 2 laparoscopic cholecystectomy.  Will need to monitor labs specially white count. Anticipate discharge 1 to 2 days.  TOTAL TIME TAKING CARE OF THIS PATIENT: 35 minutes.  >50% time spent on counselling and coordination of care  Note: This dictation was prepared with Dragon dictation along with smaller phrase technology. Any transcriptional errors that result from this process are unintentional.  Fritzi Mandes M.D    [facility]   CC: Primary care physician; Derinda Late, MD

## 2022-07-18 NOTE — Discharge Instructions (Signed)
Laparoscopic Cholecystectomy, Care After This sheet gives you information about how to care for yourself after your procedure. Your doctor may also give you more specific instructions. If you have problems or questions, contact your doctor. Follow these instructions at home: Care for cuts from surgery (incisions)  Follow instructions from your doctor about how to take care of your cuts from surgery. Make sure you: Wash your hands with soap and water before you change your bandage (dressing). If you cannot use soap and water, use hand sanitizer. Change your bandage as told by your doctor. Leave stitches (sutures), skin glue, or skin tape (adhesive) strips in place. They may need to stay in place for 2 weeks or longer. If tape strips get loose and curl up, you may trim the loose edges. Do not remove tape strips completely unless your doctor says it is okay. Do not take baths, swim, or use a hot tub until your doctor says it is okay. OK TO SHOWER 24HRS AFTER YOUR SURGERY.  Check your surgical cut area every day for signs of infection. Check for: More redness, swelling, or pain. More fluid or blood. Warmth. Pus or a bad smell. Activity Do not drive or use heavy machinery while taking prescription pain medicine. Do not play contact sports until your doctor says it is okay. Do not drive for 24 hours if you were given a medicine to help you relax (sedative). Rest as needed. Do not return to work or school until your doctor says it is okay. General instructions OK TO RESUME ASPIRIN  tylenol and advil as needed for discomfort.  Please alternate between the two every four hours as needed for pain.    Use narcotics, if prescribed, only when tylenol and motrin is not enough to control pain.  325-'650mg'$  every 8hrs to max of '3000mg'$ /24hrs (including the '325mg'$  in every norco dose) for the tylenol.    Advil up to '800mg'$  per dose every 8hrs as needed for pain.   To prevent or treat constipation while you are  taking prescription pain medicine, your doctor may recommend that you: Drink enough fluid to keep your pee (urine) clear or pale yellow. Take over-the-counter or prescription medicines. Eat foods that are high in fiber, such as fresh fruits and vegetables, whole grains, and beans. Limit foods that are high in fat and processed sugars, such as fried and sweet foods. Contact a doctor if: You develop a rash. You have more redness, swelling, or pain around your surgical cuts. You have more fluid or blood coming from your surgical cuts. Your surgical cuts feel warm to the touch. You have pus or a bad smell coming from your surgical cuts. You have a fever. One or more of your surgical cuts breaks open. You have trouble breathing. You have chest pain. You have pain that is getting worse in your shoulders. You faint or feel dizzy when you stand. You have very bad pain in your belly (abdomen). You are sick to your stomach (nauseous) for more than one day. You have throwing up (vomiting) that lasts for more than one day. You have leg pain. This information is not intended to replace advice given to you by your health care provider. Make sure you discuss any questions you have with your health care provider. Document Released: 04/30/2008 Document Revised: 02/10/2016 Document Reviewed: 01/08/2016 Elsevier Interactive Patient Education  2019 [employer].

## 2022-07-18 NOTE — Progress Notes (Signed)
Subjective:  CC: Sean Sexton is a 61 y.o. male  Hospital stay day 5, 2 Days Post-Op robotic lap chole for gallstone necrotizing pancreatitis  HPI: No acute issues overnight.  Tolerated full liquid diet ROS:  General: Denies weight loss, weight gain, fatigue, fevers, chills, and night sweats. Heart: Denies chest pain, palpitations, racing heart, irregular heartbeat, leg pain or swelling, and decreased activity tolerance. Respiratory: Denies breathing difficulty, shortness of breath, wheezing, cough, and sputum. GI: Denies change in appetite, heartburn, nausea, vomiting, constipation, diarrhea, and blood in stool. GU: Denies difficulty urinating, pain with urinating, urgency, frequency, blood in urine.   Objective:   Temp:  [98.1 F (36.7 C)-99.1 F (37.3 C)] 99.1 F (37.3 C) (12/14 1520) Pulse Rate:  [79-83] 83 (12/14 1520) Resp:  [16-20] 20 (12/14 0353) BP: (129-148)/(83-91) 129/83 (12/14 1520) SpO2:  [95 %-97 %] 95 % (12/14 1520)     Height: 6' (182.9 cm) Weight: 90.7 kg BMI (Calculated): 27.12   Intake/Output this shift:   Intake/Output Summary (Last 24 hours) at 07/18/2022 1600 Last data filed at 07/18/2022 1350 Gross per 24 hour  Intake 3364.38 ml  Output 1075 ml  Net 2289.38 ml    Constitutional :  alert, cooperative, appears stated age, and no distress  Respiratory:  clear to auscultation bilaterally  Cardiovascular:  regular rate and rhythm  Gastrointestinal: Soft, no guarding, tenderness to palpation around incisions as expected .   Skin: Cool and moist.  Incisions clean dry and intact  Psychiatric: Normal affect, non-agitated, not confused       LABS:     Latest Ref Rng & Units 07/17/2022    1:27 AM 07/16/2022    4:42 AM 07/15/2022    4:07 AM  CMP  Glucose 70 - 99 mg/dL 121  96  100   BUN 8 - 23 mg/dL '24  19  21   '$ Creatinine 0.61 - 1.24 mg/dL 1.05  0.95  0.95   Sodium 135 - 145 mmol/L 137  134  135   Potassium 3.5 - 5.1 mmol/L 4.0  3.9  3.9    Chloride 98 - 111 mmol/L 103  102  104   CO2 22 - 32 mmol/L '26  24  23   '$ Calcium 8.9 - 10.3 mg/dL 8.2  7.9  7.8   Total Protein 6.5 - 8.1 g/dL 6.8   6.1   Total Bilirubin 0.3 - 1.2 mg/dL 1.0   1.4   Alkaline Phos 38 - 126 U/L 105   89   AST 15 - 41 U/L 42   48   ALT 0 - 44 U/L 54   58       Latest Ref Rng & Units 07/18/2022    5:13 AM 07/17/2022    1:27 AM 07/16/2022    4:42 AM  CBC  WBC 4.0 - 10.5 K/uL 18.0  21.2  18.7   Hemoglobin 13.0 - 17.0 g/dL 13.2  15.1  14.9   Hematocrit 39.0 - 52.0 % 37.3  42.8  42.1   Platelets 150 - 400 K/uL 254  240  200     RADS: N/a  Assessment:   robotic lap chole for gallstone necrotizing pancreatitis.   Recovering well.  Leukocytosis resolving.  Regular diet today.  Recommend continuing IV antibiotics until leukocytosis resolves and then completing course with oral antibiotics.    Discharge follow-up instructions in the system.  labs/images/medications/previous chart entries reviewed personally and relevant changes/updates noted above.

## 2022-07-18 NOTE — Plan of Care (Signed)

## 2022-07-19 LAB — CBC
HCT: 39.6 % (ref 39.0–52.0)
Hemoglobin: 14 g/dL (ref 13.0–17.0)
MCH: 35.4 pg — ABNORMAL HIGH (ref 26.0–34.0)
MCHC: 35.4 g/dL (ref 30.0–36.0)
MCV: 100.3 fL — ABNORMAL HIGH (ref 80.0–100.0)
Platelets: 273 10*3/uL (ref 150–400)
RBC: 3.95 MIL/uL — ABNORMAL LOW (ref 4.22–5.81)
RDW: 12.7 % (ref 11.5–15.5)
WBC: 16.1 10*3/uL — ABNORMAL HIGH (ref 4.0–10.5)
nRBC: 0 % (ref 0.0–0.2)

## 2022-07-19 MED ORDER — SODIUM CHLORIDE 0.9 % IV SOLN: INTRAVENOUS | Status: DC | PRN

## 2022-07-19 NOTE — Progress Notes (Signed)
Subjective:  CC: Sean Sexton is a 61 y.o. male  Hospital stay day 6, 3 Days Post-Op robotic lap chole for gallstone necrotizing pancreatitis  HPI: No acute issues overnight.  Tolerated regular diet.    ROS:  General: Denies weight loss, weight gain, fatigue, fevers, chills, and night sweats. Heart: Denies chest pain, palpitations, racing heart, irregular heartbeat, leg pain or swelling, and decreased activity tolerance. Respiratory: Denies breathing difficulty, shortness of breath, wheezing, cough, and sputum. GI: Denies change in appetite, heartburn, nausea, vomiting, constipation, diarrhea, and blood in stool. GU: Denies difficulty urinating, pain with urinating, urgency, frequency, blood in urine.   Objective:   Temp:  [98 F (36.7 C)-99.5 F (37.5 C)] 99.5 F (37.5 C) (12/15 0801) Pulse Rate:  [69-83] 72 (12/15 0801) Resp:  [18-20] 18 (12/15 0801) BP: (117-139)/(70-88) 122/70 (12/15 0801) SpO2:  [95 %-99 %] 95 % (12/15 0801)     Height: 6' (182.9 cm) Weight: 90.7 kg BMI (Calculated): 27.12   Intake/Output this shift:   Intake/Output Summary (Last 24 hours) at 07/19/2022 2637 Last data filed at 07/19/2022 8588 Gross per 24 hour  Intake 435.21 ml  Output 1200 ml  Net -764.79 ml    Constitutional :  alert, cooperative, appears stated age, and no distress  Respiratory:  clear to auscultation bilaterally  Cardiovascular:  regular rate and rhythm  Gastrointestinal: Soft, no guarding, tenderness to palpation around incisions as expected .   Skin: Cool and moist.  Incisions clean dry and intact  Psychiatric: Normal affect, non-agitated, not confused       LABS:     Latest Ref Rng & Units 07/17/2022    1:27 AM 07/16/2022    4:42 AM 07/15/2022    4:07 AM  CMP  Glucose 70 - 99 mg/dL 121  96  100   BUN 8 - 23 mg/dL '24  19  21   '$ Creatinine 0.61 - 1.24 mg/dL 1.05  0.95  0.95   Sodium 135 - 145 mmol/L 137  134  135   Potassium 3.5 - 5.1 mmol/L 4.0  3.9  3.9    Chloride 98 - 111 mmol/L 103  102  104   CO2 22 - 32 mmol/L '26  24  23   '$ Calcium 8.9 - 10.3 mg/dL 8.2  7.9  7.8   Total Protein 6.5 - 8.1 g/dL 6.8   6.1   Total Bilirubin 0.3 - 1.2 mg/dL 1.0   1.4   Alkaline Phos 38 - 126 U/L 105   89   AST 15 - 41 U/L 42   48   ALT 0 - 44 U/L 54   58       Latest Ref Rng & Units 07/19/2022    7:20 AM 07/18/2022    5:13 AM 07/17/2022    1:27 AM  CBC  WBC 4.0 - 10.5 K/uL 16.1  18.0  21.2   Hemoglobin 13.0 - 17.0 g/dL 14.0  13.2  15.1   Hematocrit 39.0 - 52.0 % 39.6  37.3  42.8   Platelets 150 - 400 K/uL 273  254  240     RADS: N/a  Assessment:   robotic lap chole for gallstone necrotizing pancreatitis.   Recovering well.  Leukocytosis resolving.  Recommend continuing IV antibiotics until leukocytosis resolves and then completing course with oral antibiotics.  Okay to continue regular diet and discharge once leukocytosis resolves.  Surgery will peripherally follow for now.  Discharge follow-up instructions in the system.  labs/images/medications/previous chart  entries reviewed personally and relevant changes/updates noted above.

## 2022-07-19 NOTE — Progress Notes (Signed)
[facility] at [place] NAME: Sean Sexton    MR#:  [id number]  DATE OF BIRTH:  05/03/1961  SUBJECTIVE:   No family  at bedside. Patient is postop day 2. surgery advanced active regular. Not much pain. No nausea vomiting. No fever ambulating VITALS:  Blood pressure (!) 136/95, pulse 74, temperature 99.3 F (37.4 C), temperature source Oral, resp. rate 18, height 6' (1.829 m), weight 90.7 kg, SpO2 99 %.  PHYSICAL EXAMINATION:   GENERAL:  61 y.o.-year-old patient lying in the bed with no acute distress.  LUNGS: Normal breath sounds bilaterally, no wheezing CARDIOVASCULAR: S1, S2 normal. No murmur   ABDOMEN: Soft, nontender, nondistended. Bowel sounds present. Surgical incision + EXTREMITIES: No  edema b/l.    NEUROLOGIC: nonfocal  patient is alert and awake SKIN: No obvious rash, lesion, or ulcer.   LABORATORY PANEL:  CBC Recent Labs  Lab 07/19/22 0720  WBC 16.1*  HGB 14.0  HCT 39.6  PLT 273     Chemistries  Recent Labs  Lab 07/17/22 0127  NA 137  K 4.0  CL 103  CO2 26  GLUCOSE 121*  BUN 24*  CREATININE 1.05  CALCIUM 8.2*  MG 2.3  AST 42*  ALT 54*  ALKPHOS 105  BILITOT 1.0     Assessment and Plan  DAGEN BEEVERS is a 61 y.o. male with medical history significant for Hepatitis C s/p Harvoni x 12 weeks, HTN, history of adenomatous colon polyp, who presented to the ED with a 1 to 2-day history of abdominal pain in the mid upper abdomen associated with nonbloody nonbilious vomiting.   Acute gallstone pancreatitis with necrosis  lipase of 1042 on presentation, trending down now. Patient denies alcohol use Right upper quadrant ultrasound showed Cholelithiasis. But no evidence of acute cholecystitis or bile duct obstruction. --GI and GenSurg consulted  --due to elevated WBC, CT a/p obtained which showed necrosis at the level of the pancreatic neck and body.  --cont zosyn --s/p POD #3 Robotic assisted  Laparoscopic Cholecystectomy -- surgery advanced Diet to regular. White count down to 18,000>16k   Sepsis 2/2 pancreatic necrosis --fever to 100.9 morning of 12/11, with leukocytosis --blood cx negative  -- sepsis improving   Elevated LFT's --likely due to pancreatitis and sepsis  History of hepatitis C Completed prior 12-week treatment with Harvoni   Benign essential hypertension --hold home lisinopril-hydrochlorothiazide    AKI --Cr 1.37 on presentation, improved to 0.95 with IVF.     DVT prophylaxis: SCD/Compression stockings Code Status: Full code  Family Communication: none today  Level of care: Med-Surg Status is: Inpatient Remains inpatient appropriate because:.POD 3 laparoscopic cholecystectomy.  Will need to monitor labs specially white count. Anticipate discharge 1 to 2 days. Surgery recs pt stay till WBC is normal  TOTAL TIME TAKING CARE OF THIS PATIENT: 35 minutes.  >50% time spent on counselling and coordination of care  Note: This dictation was prepared with Dragon dictation along with smaller phrase technology. Any transcriptional errors that result from this process are unintentional.  Fritzi Mandes M.D    [facility]   CC: Primary care physician; Derinda Late, MD

## 2022-07-20 LAB — CBC
HCT: 39.4 % (ref 39.0–52.0)
Hemoglobin: 13.8 g/dL (ref 13.0–17.0)
MCH: 35.4 pg — ABNORMAL HIGH (ref 26.0–34.0)
MCHC: 35 g/dL (ref 30.0–36.0)
MCV: 101 fL — ABNORMAL HIGH (ref 80.0–100.0)
Platelets: 312 10*3/uL (ref 150–400)
RBC: 3.9 MIL/uL — ABNORMAL LOW (ref 4.22–5.81)
RDW: 12.8 % (ref 11.5–15.5)
WBC: 14.1 10*3/uL — ABNORMAL HIGH (ref 4.0–10.5)
nRBC: 0 % (ref 0.0–0.2)

## 2022-07-20 LAB — CULTURE, BLOOD (ROUTINE X 2)
Culture: NO GROWTH
Culture: NO GROWTH
Special Requests: ADEQUATE
Special Requests: ADEQUATE

## 2022-07-20 NOTE — Progress Notes (Signed)
[facility] at [place] NAME: Sean Sexton    MR#:  [id number]  DATE OF BIRTH:  1960-10-10  SUBJECTIVE:   No family  at bedside.surgery advanced active regular. Not much pain. No nausea vomiting. No fever ambulating VITALS:  Blood pressure 119/72, pulse 64, temperature 97.8 F (36.6 C), resp. rate 10, height 6' (1.829 m), weight 93.9 kg, SpO2 98 %.  PHYSICAL EXAMINATION:   GENERAL:  61 y.o.-year-old patient lying in the bed with no acute distress.  LUNGS: Normal breath sounds bilaterally, no wheezing CARDIOVASCULAR: S1, S2 normal. No murmur   ABDOMEN: Soft, nontender, nondistended. Bowel sounds present. Surgical incision + EXTREMITIES: No  edema b/l.    NEUROLOGIC: nonfocal  patient is alert and awake SKIN: No obvious rash, lesion, or ulcer.   LABORATORY PANEL:  CBC Recent Labs  Lab 07/20/22 0438  WBC 14.1*  HGB 13.8  HCT 39.4  PLT 312     Chemistries  Recent Labs  Lab 07/17/22 0127  NA 137  K 4.0  CL 103  CO2 26  GLUCOSE 121*  BUN 24*  CREATININE 1.05  CALCIUM 8.2*  MG 2.3  AST 42*  ALT 54*  ALKPHOS 105  BILITOT 1.0     Assessment and Plan  Sean Sexton is a 61 y.o. male with medical history significant for Hepatitis C s/p Harvoni x 12 weeks, HTN, history of adenomatous colon polyp, who presented to the ED with a 1 to 2-day history of abdominal pain in the mid upper abdomen associated with nonbloody nonbilious vomiting.   Acute gallstone pancreatitis with necrosis  lipase of 1042 on presentation, trending down now. Patient denies alcohol use Right upper quadrant ultrasound showed Cholelithiasis. But no evidence of acute cholecystitis or bile duct obstruction. --GI and GenSurg consulted  --due to elevated WBC, CT a/p obtained which showed necrosis at the level of the pancreatic neck and body.  --cont zosyn --s/p POD #4 Robotic assisted Laparoscopic Cholecystectomy -- surgery advanced Diet to regular.  White count down to 18,000>16k>14K   Sepsis 2/2 pancreatic necrosis --fever to 100.9 morning of 12/11, with leukocytosis --blood cx negative  -- sepsis improving   Elevated LFT's --likely due to pancreatitis and sepsis  History of hepatitis C Completed prior 12-week treatment with Harvoni   Benign essential hypertension --hold home lisinopril-hydrochlorothiazide    AKI --Cr 1.37 on presentation, improved to 0.95 with IVF.     DVT prophylaxis: SCD/Compression stockings Code Status: Full code  Family Communication: none today  Level of care: Med-Surg Status is: Inpatient Remains inpatient appropriate because:.POD 4 laparoscopic cholecystectomy.  Will need to monitor labs specially white count. Anticipate discharge 1 to 2 days. Surgery recs pt stay till WBC is normal  TOTAL TIME TAKING CARE OF THIS PATIENT: 35 minutes.  >50% time spent on counselling and coordination of care  Note: This dictation was prepared with Dragon dictation along with smaller phrase technology. Any transcriptional errors that result from this process are unintentional.  Fritzi Mandes M.D    [facility]   CC: Primary care physician; Derinda Late, MD

## 2022-07-21 LAB — CBC
HCT: 40.5 % (ref 39.0–52.0)
Hemoglobin: 14.2 g/dL (ref 13.0–17.0)
MCH: 35.3 pg — ABNORMAL HIGH (ref 26.0–34.0)
MCHC: 35.1 g/dL (ref 30.0–36.0)
MCV: 100.7 fL — ABNORMAL HIGH (ref 80.0–100.0)
Platelets: 361 10*3/uL (ref 150–400)
RBC: 4.02 MIL/uL — ABNORMAL LOW (ref 4.22–5.81)
RDW: 12.7 % (ref 11.5–15.5)
WBC: 14.5 10*3/uL — ABNORMAL HIGH (ref 4.0–10.5)
nRBC: 0 % (ref 0.0–0.2)

## 2022-07-21 MED ORDER — AMOXICILLIN-POT CLAVULANATE 875-125 MG PO TABS: 1.0000 | ORAL_TABLET | Freq: Two times a day (BID) | ORAL | 0 refills | Status: AC

## 2022-07-21 MED ORDER — AMOXICILLIN-POT CLAVULANATE 875-125 MG PO TABS: 1.0000 | ORAL_TABLET | Freq: Two times a day (BID) | ORAL | Status: DC

## 2022-07-21 NOTE — Discharge Summary (Signed)
Physician Discharge Summary   Patient: Sean Sexton MRN: [id number] DOB: 04/27/1961  Admit date:     07/13/2022  Discharge date: 07/21/22  Discharge Physician: Fritzi Mandes   PCP: Derinda Late, MD   Recommendations at discharge:    F/u Dr Lysle Pearl 2 weeks F/u PCP in 1 week  Discharge Diagnoses: Principal Problem:   Acute necrotizing pancreatitis Active Problems:   Benign essential hypertension   History of hepatitis C   Gall stone pancreatitis   Hospital Course: Sean Sexton is a 61 y.o. male with medical history significant for Hepatitis C s/p Harvoni x 12 weeks, HTN, history of adenomatous colon polyp, who presented to the ED with a 1 to 2-day history of abdominal pain in the mid upper abdomen associated with nonbloody nonbilious vomiting.   Acute gallstone pancreatitis with necrosis  lipase of 1042 on presentation, trending down now. Patient denies alcohol use Right upper quadrant ultrasound showed Cholelithiasis. But no evidence of acute cholecystitis or bile duct obstruction. --GI and GenSurg consulted  --due to elevated WBC, CT a/p obtained which showed necrosis at the level of the pancreatic neck and body.  --cont zosyn --s/p POD #5 Robotic assisted Laparoscopic Cholecystectomy -- surgery advanced Diet to regular. White count down to 18,000>16k>14K>14K --no pain, had large BM and feels a lot better --d/w pt and wife will d/c to home with po abx for 4 more days and f/u Dr Lysle Pearl. They are in agreement   Sepsis 2/2 pancreatic necrosis --fever to 100.9 morning of 12/11, with leukocytosis --blood cx negative  -- sepsis resolved   Elevated LFT's --likely due to pancreatitis and sepsis   History of hepatitis C Completed prior 12-week treatment with Harvoni   Benign essential hypertension --hold home lisinopril-hydrochlorothiazide and take if BP >>130   AKI --Cr 1.37 on presentation, improved to 0.95 with IVF.     DVT prophylaxis: SCD/Compression  stockings Code Status: Full code  Family Communication: wife at bedside     Pain control - [place] Controlled Substance Reporting System database was reviewed. and patient was instructed, not to drive, operate heavy machinery, perform activities at heights, swimming or participation in water activities or provide baby-sitting services while on Pain, Sleep and Anxiety Medications; until their outpatient Physician has advised to do so again. Also recommended to not to take more than prescribed Pain, Sleep and Anxiety Medications.  Consultants: dr Lysle Pearl (gen surgery) Procedures performed: Lap chole  Disposition: Home Diet recommendation:  Discharge Diet Orders (From admission, onward)     Start     Ordered   07/21/22 0000  Diet - low sodium heart healthy        07/21/22 1018           Regular diet DISCHARGE MEDICATION: Allergies as of 07/21/2022   No Known Allergies      Medication List     TAKE these medications    acetaminophen 325 MG tablet Commonly known as: Tylenol Take 2 tablets (650 mg total) by mouth every 8 (eight) hours as needed for mild pain.   amoxicillin-clavulanate 875-125 MG tablet Commonly known as: AUGMENTIN Take 1 tablet by mouth every 12 (twelve) hours for 4 days.   aspirin EC 81 MG tablet Take 81 mg by mouth daily.   docusate sodium 100 MG capsule Commonly known as: Colace Take 1 capsule (100 mg total) by mouth 2 (two) times daily as needed for up to 10 days for mild constipation.   HYDROcodone-acetaminophen 5-325 MG tablet Commonly  known as: Norco Take 1 tablet by mouth every 6 (six) hours as needed for up to 6 doses for moderate pain.   lisinopril-hydrochlorothiazide 20-12.5 MG tablet Commonly known as: ZESTORETIC Take 1 tablet by mouth daily.   multivitamin tablet Take 1 tablet by mouth daily.   pantoprazole 20 MG tablet Commonly known as: PROTONIX Take 20 mg by mouth daily.   sildenafil 20 MG tablet Commonly known as:  REVATIO Take 60 mg by mouth daily as needed.        Follow-up Information     Lysle Pearl, Isami, DO Follow up in 2 week(s).   Specialty: Surgery Why: post op lap chole Contact information: [place] [address] [phone]         Derinda Late, MD. Schedule an appointment as soon as possible for a visit in 1 week(s).   Specialty: Family Medicine Why: hospital f/u Contact information: [address] [facility] [place] [address] [phone]                Discharge Exam: Danley Danker Weights   07/12/22 2138 07/12/22 2148 07/20/22 0455  Weight: 90.7 kg 90.7 kg 93.9 kg     Condition at discharge: fair  The results of significant diagnostics from this hospitalization (including imaging, microbiology, ancillary and laboratory) are listed below for reference.   Imaging Studies: CT ABDOMEN PELVIS W CONTRAST  Result Date: 07/14/2022 CLINICAL DATA:  Abdominal pain. EXAM: CT ABDOMEN AND PELVIS WITH CONTRAST TECHNIQUE: Multidetector CT imaging of the abdomen and pelvis was performed using the standard protocol following bolus administration of intravenous contrast. RADIATION DOSE REDUCTION: This exam was performed according to the departmental dose-optimization program which includes automated exposure control, adjustment of the mA and/or kV according to patient size and/or use of iterative reconstruction technique. CONTRAST:  28m OMNIPAQUE IOHEXOL 350 MG/ML SOLN COMPARISON:  Previous abdomen ultrasounds, most recent 07/13/2022. FINDINGS: Lower chest: Trace effusions and dependent lower lobe atelectasis. No acute findings. Hepatobiliary: 3 subcentimeter low-attenuation liver lesions. 6 mm lesion at the dome of the right lobe, smaller low-attenuation lesion from the lateral margin of the left lobe and tiny lesion in the central right lobe, all consistent with cysts. Liver demonstrates central volume loss. This raises  possibility of cirrhosis as was suggested on the recent ultrasound. No other liver abnormality. Multiple gallstones. No gallbladder wall thickening or evidence of acute cholecystitis. No bile duct dilation. Pancreas: Peripancreatic inflammatory changes and fluid. There is lack of enhancement at the level of the pancreatic neck and body consistent with necrosis. Remainder of the pancreas demonstrates normal enhancement. No mass. No duct dilation. No defined peripancreatic fluid collection. Spleen: Normal in size without focal abnormality. Adrenals/Urinary Tract: Normal adrenal glands. Kidneys normal size, orientation and position with symmetric enhancement and excretion. Small nonobstructing stone, upper pole the left kidney. No renal masses. No hydronephrosis. Normal ureters. Normal bladder. Stomach/Bowel: Decompressed stomach, otherwise unremarkable. Small bowel and colon are normal in caliber. No wall thickening. No inflammation. Normal appendix visualized. Vascular/Lymphatic: Minimal aortic atherosclerosis. No aneurysm. Several prominent gastrohepatic ligament lymph nodes, all subcentimeter in short axis. Reproductive: Prominent prostate, 4.1 x 3.8 cm transversely. Other: Small amount of ascites collects adjacent to the liver and spleen, lies adjacent to the pancreas and extends along the small bowel mesentery, as well as collecting in the pelvis. Musculoskeletal: No fracture or acute finding.  No bone lesion. IMPRESSION: 1. Acute pancreatitis complicated by necrosis at the level of the pancreatic  neck and body. No defined peripancreatic fluid collection. There is associated peripancreatic inflammation as well as a small amount of ascites. 2. Multiple gallstones. No evidence of acute cholecystitis. No visualized duct stone and no duct dilation. Electronically Signed   By: Lajean Manes M.D.   On: 07/14/2022 13:06   US ABDOMEN LIMITED RUQ (LIVER/GB)  Result Date: 07/13/2022 CLINICAL DATA:  61 year old male  with abnormal lipase. Query gallstone pancreatitis. History of chronic hepatitis C. EXAM: ULTRASOUND ABDOMEN LIMITED RIGHT UPPER QUADRANT COMPARISON:  Abdomen ultrasound 07/21/2015. FINDINGS: Gallbladder: Shadowing echogenic gallstones individually estimated up to 13 mm. Gallbladder wall thickness is at the upper limits of normal, 2-3 mm. No pericholecystic fluid. No sonographic Murphy sign elicited. Common bile duct: Diameter: 4 mm, normal. Liver: Background liver echogenicity appears normal. Mildly nodular liver contour suggested on multiple images (image 51). No discrete liver lesion. Portal vein is patent on color Doppler imaging with normal direction of blood flow towards the liver. Other: Questionable trace perihepatic fluid on image 51. Grossly negative visible right kidney. IMPRESSION: 1. Cholelithiasis. But no evidence of acute cholecystitis or bile duct obstruction. 2. Mildly nodular liver contour suspicious for Cirrhosis. Possible trace ascites. No discrete liver lesion by ultrasound. Electronically Signed   By: Genevie Ann M.D.   On: 07/13/2022 05:46    Microbiology: Results for orders placed or performed during the hospital encounter of 07/13/22  Culture, blood (Routine X 2) w Reflex to ID Panel     Status: None   Collection Time: 07/15/22 11:29 AM   Specimen: BLOOD  Result Value Ref Range Status   Specimen Description BLOOD BLOOD LEFT ARM  Final   Special Requests   Final    BOTTLES DRAWN AEROBIC AND ANAEROBIC Blood Culture adequate volume   Culture   Final    NO GROWTH 5 DAYS Performed at [facility], [address] [place], [place] [address]    Report Status 07/20/2022 FINAL  Final  Culture, blood (Routine X 2) w Reflex to ID Panel     Status: None   Collection Time: 07/15/22 11:29 AM   Specimen: BLOOD  Result Value Ref Range Status   Specimen Description BLOOD BLOOD LEFT HAND  Final   Special Requests   Final    BOTTLES DRAWN AEROBIC AND ANAEROBIC Blood Culture adequate  volume   Culture   Final    NO GROWTH 5 DAYS Performed at [facility], [address] [place], [place] [address]    Report Status 07/20/2022 FINAL  Final    Labs: CBC: Recent Labs  Lab 07/17/22 0127 07/18/22 0513 07/19/22 0720 07/20/22 0438 07/21/22 0445  WBC 21.2* 18.0* 16.1* 14.1* 14.5*  HGB 15.1 13.2 14.0 13.8 14.2  HCT 42.8 37.3* 39.6 39.4 40.5  MCV 101.7* 100.8* 100.3* 101.0* 100.7*  PLT 240 254 273 312 945   Basic Metabolic Panel: Recent Labs  Lab 07/15/22 0407 07/16/22 0442 07/17/22 0127  NA 135 134* 137  K 3.9 3.9 4.0  CL 104 102 103  CO2 '23 24 26  '$ GLUCOSE 100* 96 121*  BUN 21 19 24*  CREATININE 0.95 0.95 1.05  CALCIUM 7.8* 7.9* 8.2*  MG 1.9 2.0 2.3   Liver Function Tests: Recent Labs  Lab 07/15/22 0407 07/17/22 0127  AST 48* 42*  ALT 58* 54*  ALKPHOS 89 105  BILITOT 1.4* 1.0  PROT 6.1* 6.8  ALBUMIN 2.8* 2.7*   CBG: Recent Labs  Lab 07/17/22 1934  GLUCAP 100*  Discharge time spent: greater than 30 minutes.  Signed: Fritzi Mandes, MD [facility] 07/21/2022

## 2023-05-21 ENCOUNTER — Other Ambulatory Visit: Payer: Self-pay

## 2023-05-21 DIAGNOSIS — Z125 Encounter for screening for malignant neoplasm of prostate: Secondary | ICD-10-CM

## 2023-05-23 ENCOUNTER — Other Ambulatory Visit: Payer: Self-pay | Admitting: *Deleted

## 2023-05-23 DIAGNOSIS — Z125 Encounter for screening for malignant neoplasm of prostate: Secondary | ICD-10-CM

## 2023-05-23 NOTE — Progress Notes (Signed)
Patient: Sean Sexton           Date of Birth: 13-May-1961           MRN: [id number] Visit Date: 05/23/2023 PCP: Kandyce Rud, MD  Prostate Cancer Screening Date of last physical exam:  (December 2023) Date of last rectal exam:  (November 2023) Have you ever had any of the following?: Family member with prostate cancer Have you ever had or been told you have an allergy to latex products?: No Are you currently taking any natural prostate preparations?: No Are you currently experiencing any urinary symptoms?: No  Prostate Exam Exam not completed. PSA blood test only completed.  Patient's History Patient Active Problem List   Diagnosis Date Noted   Acute necrotizing pancreatitis 07/13/2022   Gall stone pancreatitis 07/13/2022   Benign essential hypertension 06/13/2014   History of hepatitis C 06/13/2014   Past Medical History:  Diagnosis Date   Hepatitis    hept.c   Hypertension     No family history on file.  Social History   Occupational History   Not on file  Tobacco Use   Smoking status: Former   Smokeless tobacco: Not on file  Substance and Sexual Activity   Alcohol use: No   Drug use: No   Sexual activity: Not on file

## 2023-05-24 LAB — PSA: Prostate Specific Ag, Serum: 0.8 ng/mL (ref 0.0–4.0)
